# Patient Record
Sex: Male | Born: 1940 | Race: White | Hispanic: No | Marital: Married | Smoking: Former smoker
Health system: Southern US, Community
[De-identification: ages and names within clinical notes are randomized; demographics above are authoritative.]

## PROBLEM LIST (undated history)

## (undated) DIAGNOSIS — K219 Gastro-esophageal reflux disease without esophagitis: Secondary | ICD-10-CM

## (undated) DIAGNOSIS — E119 Type 2 diabetes mellitus without complications: Secondary | ICD-10-CM

## (undated) DIAGNOSIS — Z950 Presence of cardiac pacemaker: Secondary | ICD-10-CM

## (undated) DIAGNOSIS — Z8489 Family history of other specified conditions: Secondary | ICD-10-CM

## (undated) DIAGNOSIS — I2510 Atherosclerotic heart disease of native coronary artery without angina pectoris: Secondary | ICD-10-CM

## (undated) DIAGNOSIS — C801 Malignant (primary) neoplasm, unspecified: Secondary | ICD-10-CM

## (undated) DIAGNOSIS — I10 Essential (primary) hypertension: Secondary | ICD-10-CM

## (undated) DIAGNOSIS — N400 Enlarged prostate without lower urinary tract symptoms: Secondary | ICD-10-CM

## (undated) DIAGNOSIS — Z87442 Personal history of urinary calculi: Secondary | ICD-10-CM

## (undated) DIAGNOSIS — M1990 Unspecified osteoarthritis, unspecified site: Secondary | ICD-10-CM

## (undated) HISTORY — PX: LITHOTRIPSY: SUR834

## (undated) HISTORY — PX: EYE SURGERY: SHX253

## (undated) HISTORY — PX: PACEMAKER INSERTION: SHX728

## (undated) HISTORY — PX: OTHER SURGICAL HISTORY: SHX169

## (undated) HISTORY — PX: PACEMAKER REVISION: SHX5999

## (undated) HISTORY — PX: CARDIAC SURGERY: SHX584

---

## 1998-07-08 HISTORY — PX: CORONARY ARTERY BYPASS GRAFT: SHX141

## 1998-12-19 ENCOUNTER — Inpatient Hospital Stay (HOSPITAL_COMMUNITY): Admission: AD | Admit: 1998-12-19 | Discharge: 1998-12-26 | Payer: Self-pay | Admitting: Cardiovascular Disease

## 1998-12-20 ENCOUNTER — Encounter: Payer: Self-pay | Admitting: Thoracic Surgery (Cardiothoracic Vascular Surgery)

## 1998-12-22 ENCOUNTER — Encounter: Payer: Self-pay | Admitting: Thoracic Surgery (Cardiothoracic Vascular Surgery)

## 1998-12-23 ENCOUNTER — Encounter: Payer: Self-pay | Admitting: Thoracic Surgery (Cardiothoracic Vascular Surgery)

## 1998-12-24 ENCOUNTER — Encounter: Payer: Self-pay | Admitting: Surgery

## 2012-07-08 HISTORY — PX: BLEPHAROPLASTY: SUR158

## 2015-07-09 DIAGNOSIS — Z950 Presence of cardiac pacemaker: Secondary | ICD-10-CM

## 2015-07-09 HISTORY — DX: Presence of cardiac pacemaker: Z95.0

## 2016-06-18 ENCOUNTER — Inpatient Hospital Stay: Admission: RE | Admit: 2016-06-18 | Payer: Self-pay | Source: Ambulatory Visit

## 2016-06-19 ENCOUNTER — Inpatient Hospital Stay: Admission: RE | Admit: 2016-06-19 | Payer: Self-pay | Source: Ambulatory Visit

## 2016-06-20 ENCOUNTER — Encounter
Admission: RE | Admit: 2016-06-20 | Discharge: 2016-06-20 | Disposition: A | Discharge status: Home / Self Care | Payer: Medicare Other | Source: Ambulatory Visit | Attending: Urology | Admitting: Urology

## 2016-06-20 HISTORY — DX: Presence of cardiac pacemaker: Z95.0

## 2016-06-20 HISTORY — DX: Unspecified osteoarthritis, unspecified site: M19.90

## 2016-06-20 HISTORY — DX: Essential (primary) hypertension: I10

## 2016-06-20 HISTORY — DX: Family history of other specified conditions: Z84.89

## 2016-06-20 HISTORY — DX: Malignant (primary) neoplasm, unspecified: C80.1

## 2016-06-20 HISTORY — DX: Personal history of urinary calculi: Z87.442

## 2016-06-20 HISTORY — DX: Type 2 diabetes mellitus without complications: E11.9

## 2016-06-20 HISTORY — DX: Gastro-esophageal reflux disease without esophagitis: K21.9

## 2016-06-20 NOTE — Patient Instructions (Signed)
  Your procedure is scheduled on: 06-25-16 Report to Same Day Surgery 2nd floor medical mall [facility] Entrance-take elevator on left to 2nd floor.  Check in with surgery information desk.) To find out your arrival time please call [phone] between 1PM - 3PM on 06-24-16  Remember: Instructions that are not followed completely may result in serious medical risk, up to and including death, or upon the discretion of your surgeon and anesthesiologist your surgery may need to be rescheduled.    _x___ 1. Do not eat food or drink liquids after midnight. No gum chewing or hard candies.     __x__ 2. No Alcohol for 24 hours before or after surgery.   __x__3. No Smoking for 24 prior to surgery.   ____  4. Bring all medications with you on the day of surgery if instructed.    __x__ 5. Notify your doctor if there is any change in your medical condition     (cold, fever, infections).     Do not wear jewelry, make-up, hairpins, clips or nail polish.  Do not wear lotions, powders, or perfumes. You may wear deodorant.  Do not shave 48 hours prior to surgery. Men may shave face and neck.  Do not bring valuables to the hospital.    [facility] is not responsible for any belongings or valuables.               Contacts, dentures or bridgework may not be worn into surgery.  Leave your suitcase in the car. After surgery it may be brought to your room.  For patients admitted to the hospital, discharge time is determined by your treatment team.   Patients discharged the day of surgery will not be allowed to drive home.  You will need someone to drive you home and stay with you the night of your procedure.    Please read over the following fact sheets that you were given:   [facility] Preparing for Surgery and or MRSA Information   _x___ Take these medicines the morning of surgery with A SIP OF WATER:    1. QUINAPRIL  2. RISPERIDONE  3. ZANTAC  4.  5.  6.  ____Fleets enema or Magnesium  Citrate as directed.   ____ Use CHG Soap or sage wipes as directed on instruction sheet   ____ Use inhalers on the day of surgery and bring to hospital day of surgery  ____ Stop metformin 2 days prior to surgery    ____ Take 1/2 of usual insulin dose the night before surgery and none on the morning of surgery.   ____ Stop Aspirin, Coumadin, Pllavix ,Eliquis, Effient, or Pradaxa-PT STOPPED ASA 06-17-16  x__ Stop Anti-inflammatories such as Advil, Aleve, Ibuprofen, Motrin, Naproxen,          Naprosyn, Goodies powders or aspirin products. Ok to take Tylenol.   ____ Stop supplements until after surgery.    ____ Bring C-Pap to the hospital.

## 2016-06-20 NOTE — Pre-Procedure Instructions (Signed)
Procedure - Cardiac - Lucianne Lei, MD - 11/22/2015 10:07 AM EDT  ELECTROPHYSIOLOGY STUDY  DATE OF STUDY: 11/22/2015  PROCEDURE PERFORMED: Dual-chamber pacemaker generator change out.  INDICATIONS: Pacemaker end-of-life.  SYSTEM COMPONENTS: 1. Pacemaker generator is a Medtronic [id number], serial [id number]. 2. Atrial lead is a Guidant [id number], serial [id number]. Implant date 02/14/2006. 3. Right ventricular lead is a Medtronic [id number], serial [id number]. Implant was 02/14/2006.  Explanted generator is a Medtronic [id number], serial [id number]. Implant 02/14/2006. TYRX absorbable antibiotic pouch.  PROCEDURE DESCRIPTION: The patient was brought to the Electrophysiology Laboratory in a post-absorptive, nonsedated state. Noninvasive blood pressure and arterial oxygen saturation monitoring were established. Standard sterile prep and drape were performed. Standard timeout procedure and sedation were performed. Lead fluoroscopy was performed of generator and leads. The pacemaker pocket was opened over the existing incision. Generator leads were exposed. The leads were tested. Atrial lead P-wave 3.9, impedance 470, pacing threshold 0.6. R-wave 4.8, impedance 491, pacing threshold 0.9. Leads were then connected to the new pacer generator which was placed inside the TYRX pouch and placed into the pocket. Of note, the pocket had been opened inferior and posterior to the fibrous capsule and the pectoralis fascial plane. No active bleeding was present. The pocket was then irrigated with sterile bacitracin antibiotic solution and Arista Powder placed. The pocket was then closed in 3 running layers with absorbable suture followed by Steri-Strips, Dermabond and a Mepilex dressing. Sponge, needle, instrument counts were correct x 2. There were no complications.  ESTIMATED BLOOD LOSS: Less than 20 mL.  IMPRESSION: The patient is status post successful pacemaker generator change out.  Odette Fraction,  MD  D: 11/22/2015 10:07:10 T: 11/23/2015 04:52:17 DF/NTS [id number]  cc:  Back to top of Miscellaneous Notes D/C Summary - Lucianne Lei, MD - 11/22/2015 10:04 AM EDT  DISCHARGE SUMMARY  DATE OF ADMISSION: 11/22/2015.  DATE OF DISCHARGE: 11/22/2015.  DISCHARGE DIAGNOSES: 1. Pacemaker generator change out, dual chamber. 2. Paroxysmal high grade AV block. 3. Status post coronary artery bypass grafting. 4. Diabetes mellitus. 5. Hyperlipidemia.  PROCEDURES: Dual chamber pacemaker generator change out.  HOSPITAL COURSE: The patient was admitted with his pacemaker ER characteristics. Lead characteristics were normal. He underwent change out to a new Medtronic device. He was discharged home in stable condition.  DISCHARGE MANAGEMENT AND PLAN: The patient will be discharged for outpatient followup. He will continue same home medications detailed on [facility] including: 1. Allopurinol. 2. Trilipix. 3. Allegra. 4. Glucotrol-XL. 5. Trileptal. 6. Quinapril/hydrochlorothiazide. 7. Risperdal. 8. Crestor. 9. Januvia. 10. Flomax.  He has outpatient followup as scheduled.  Odette Fraction, MD  D: 11/22/2015 10:04:49 T: 11/23/2015 04:32:03 DF/NTS [id number]  cc:  Back to top of Miscellaneous Notes MD Prog Notes - Lucianne Lei, MD - 11/22/2015 9:58 AM EDT Only the most recent of 2 notes of type MD Prog Notes is displayed.  PM Gen Changeout [place] [phone]  Back to top of Miscellaneous Notes Plan of Care - Irma Newness, RN - 11/22/2015 6:53 AM EDT Problem: General Plan of Care Goal: Appropriate Progression to Discharge Outcome: Ongoing Bedrest teaching done,importance of keeping right arm or leg straight post procedure, to minimize or decrease chance of bruising post procedure. Handwashing, falls risk reviewed.    Back to top of Miscellaneous Notes   Plan of Treatment - as of this encounter  Scheduled Tests Scheduled Tests  Name  Priority Associated Diagnoses Order Schedule  EP LAB PROCEDURE Routine  ONCE for 1 Occurrences  starting 11/22/2015 until 11/22/2015   Lab Results - in this encounter  Table of Contents for Lab Results GLUCOSE, POC (GLUCOMETER) (11/22/2015 10:26 AM) TSH (11/22/2015 7:06 AM) MAGNESIUM (11/22/2015 7:06 AM) COMPREHENSIVE METABOLIC PANEL (99991111 7:06 AM) CBC WITH DIFF (11/22/2015 7:06 AM) BNP (11/22/2015 7:06 AM)    GLUCOSE, POC (GLUCOMETER) (11/22/2015 10:26 AM) Only the most recent of 2 results within the time period is included.  GLUCOSE, POC (GLUCOMETER) (11/22/2015 10:26 AM)  Component Value Ref Range  Glucose (poct) 160 (H)Comment: Notified RN 70 - 100 mg/dL   GLUCOSE, POC (GLUCOMETER) (11/22/2015 10:26 AM)  Specimen Performing Laboratory   INTERFACE USE   Back to top of Lab Results   TSH (11/22/2015 7:06 AM) TSH (11/22/2015 7:06 AM)  Component Value Ref Range  TSH 3.15 0.35 - 4.94 uIU/mL   TSH (11/22/2015 7:06 AM)  Specimen Performing Laboratory   INTERFACE USE   Back to top of Lab Results   MAGNESIUM (11/22/2015 7:06 AM) MAGNESIUM (11/22/2015 7:06 AM)  Component Value Ref Range  Magnesium 2.1 1.6 - 2.6 mg/dL   MAGNESIUM (11/22/2015 7:06 AM)  Specimen Performing Laboratory   INTERFACE USE   Back to top of Lab Results   COMPREHENSIVE METABOLIC PANEL (99991111 7:06 AM) COMPREHENSIVE METABOLIC PANEL (99991111 7:06 AM)  Component Value Ref Range  Sodium 136 136 - 145 mEq/L  Potassium 4.3 3.5 - 4.5 mEq/L  Chloride 104 98 - 107 mEq/L  Bicarbonate (TCO2) 20 (L) 23 - 31 mEq/L  Calcium 9.7 8.4 - 10.2 mg/dL  Glucose 159 (H) 70 - 105 mg/dL  BUN 15 8 - 26 mg/dL  Protein, Total 7.1 6.2 - 8.1 g/dL  Albumin 4.5 3.2 - 4.6 g/dL  Bilirubin, Total 0.4 0.2 - 1.2 mg/dL  Alkaline Phosphatase 53 40 - 150 U/L  AST (SGOT) 25 5 - 34 U/L  Creatinine 1.14 0.72 - 1.25 mg/dL  Anion Gap (calc.) 12 4 - 12 mEq/L  ALT (SGPT) 26 0 - 55 U/L  GFR, non-AA, (est.) 63 >59  mL/Min/1.73 m2  GFR, AA, (est.) 76 >59 mL/Min/1.73 m2   COMPREHENSIVE METABOLIC PANEL (99991111 7:06 AM)  Specimen Performing Laboratory   INTERFACE USE   Back to top of Lab Results   CBC WITH DIFF (11/22/2015 7:06 AM) CBC WITH DIFF (11/22/2015 7:06 AM)  Component Value Ref Range  WBC 6.20 4.5 - 11.0 k/uL  RBC 4.42 4.4 - 5.9 M/uL  Hemoglobin 14.0 13.0 - 18.0 g/dL  Hematocrit (calc.) 41.1 40 - 52 %  MCV 93 80 - 100 fL  MCH 31.7 26 - 34 pg  MCHC (calc.) 34.1 32 - 36 g/dL  RDW 13.9 <14.5 %  Platelet 138 (L) 150 - 440 k/uL  Mean Platelet Volume 9.9 7.4 - 10.6 fL  Neutrophil (%) 70 %  Lymphocyte (%) 18 %  Monocyte (%) 10 %  Eosinophil (%) 1 %  Basophil (%) 1 %  Neutrophil (#) 4.40 1.8 - 7.7 k/uL  Lymphocyte (#) 1.10 1.0 - 4.8 k/uL  Monocyte (#) 0.60 0.0 - 0.8 k/uL  Eosinophil (#) 0.10 0.0 - 0.5 k/uL  Basophil (#) 0.10 0.0 - 0.2 k/uL   CBC WITH DIFF (11/22/2015 7:06 AM)  Specimen Performing Laboratory   INTERFACE USE   Back to top of Lab Results   BNP (11/22/2015 7:06 AM) BNP (11/22/2015 7:06 AM)  Component Value Ref Range  Brain Natriuretic Peptide (BNP) 10 <100 pg/mL   BNP (11/22/2015 7:06 AM)  Specimen Performing Laboratory   INTERFACE USE  Back to top of Lab Results

## 2016-06-24 NOTE — Pre-Procedure Instructions (Signed)
CALLED PTS PCP OFFICE IN [place] TO SEE IF LABS AND EKG WERE COMPLETED-RECEPTIONIST STATES THAT HE HAD BLOODWORK DONE BUT THAT THE PCP HAS NOT SIGNED OFF ON THE LABS TO REVIEW THEM AND THEY CANNOT FAX ANY OF THIS UNTIL THE MD HAS SIGNED OFF. PT NOT FOR SURGERY UNTIL AFTER LUNCH SO I WILL TRY AGAIN IN AM

## 2016-06-25 ENCOUNTER — Ambulatory Visit: Payer: Medicare Other | Admitting: Anesthesiology

## 2016-06-25 ENCOUNTER — Ambulatory Visit
Admission: RE | Admit: 2016-06-25 | Discharge: 2016-06-25 | Disposition: A | Discharge status: Home / Self Care | Payer: Medicare Other | Source: Ambulatory Visit | Attending: Urology | Admitting: Urology

## 2016-06-25 ENCOUNTER — Encounter: Payer: Self-pay | Admitting: *Deleted

## 2016-06-25 ENCOUNTER — Encounter
Admission: RE | Disposition: A | Discharge status: Home / Self Care | Payer: Self-pay | Source: Ambulatory Visit | Attending: Urology

## 2016-06-25 DIAGNOSIS — N401 Enlarged prostate with lower urinary tract symptoms: Secondary | ICD-10-CM | POA: Insufficient documentation

## 2016-06-25 DIAGNOSIS — M1990 Unspecified osteoarthritis, unspecified site: Secondary | ICD-10-CM | POA: Diagnosis not present

## 2016-06-25 DIAGNOSIS — Z87891 Personal history of nicotine dependence: Secondary | ICD-10-CM | POA: Insufficient documentation

## 2016-06-25 DIAGNOSIS — I10 Essential (primary) hypertension: Secondary | ICD-10-CM | POA: Insufficient documentation

## 2016-06-25 DIAGNOSIS — I252 Old myocardial infarction: Secondary | ICD-10-CM | POA: Insufficient documentation

## 2016-06-25 DIAGNOSIS — E7800 Pure hypercholesterolemia, unspecified: Secondary | ICD-10-CM | POA: Insufficient documentation

## 2016-06-25 DIAGNOSIS — Z7982 Long term (current) use of aspirin: Secondary | ICD-10-CM | POA: Diagnosis not present

## 2016-06-25 DIAGNOSIS — K219 Gastro-esophageal reflux disease without esophagitis: Secondary | ICD-10-CM | POA: Diagnosis not present

## 2016-06-25 DIAGNOSIS — F329 Major depressive disorder, single episode, unspecified: Secondary | ICD-10-CM | POA: Diagnosis not present

## 2016-06-25 DIAGNOSIS — M109 Gout, unspecified: Secondary | ICD-10-CM | POA: Diagnosis not present

## 2016-06-25 DIAGNOSIS — Z79899 Other long term (current) drug therapy: Secondary | ICD-10-CM | POA: Diagnosis not present

## 2016-06-25 DIAGNOSIS — Z85828 Personal history of other malignant neoplasm of skin: Secondary | ICD-10-CM | POA: Diagnosis not present

## 2016-06-25 DIAGNOSIS — N138 Other obstructive and reflux uropathy: Secondary | ICD-10-CM | POA: Insufficient documentation

## 2016-06-25 DIAGNOSIS — N320 Bladder-neck obstruction: Secondary | ICD-10-CM | POA: Diagnosis not present

## 2016-06-25 DIAGNOSIS — F419 Anxiety disorder, unspecified: Secondary | ICD-10-CM | POA: Diagnosis not present

## 2016-06-25 DIAGNOSIS — E119 Type 2 diabetes mellitus without complications: Secondary | ICD-10-CM | POA: Diagnosis not present

## 2016-06-25 DIAGNOSIS — Z950 Presence of cardiac pacemaker: Secondary | ICD-10-CM | POA: Insufficient documentation

## 2016-06-25 HISTORY — PX: GREEN LIGHT LASER TURP (TRANSURETHRAL RESECTION OF PROSTATE: SHX6260

## 2016-06-25 LAB — GLUCOSE, CAPILLARY
GLUCOSE-CAPILLARY: 129 mg/dL — AB (ref 65–99)
GLUCOSE-CAPILLARY: 113 mg/dL — AB (ref 65–99)

## 2016-06-25 SURGERY — GREEN LIGHT LASER TURP (TRANSURETHRAL RESECTION OF PROSTATE
Anesthesia: General

## 2016-06-25 MED ORDER — FENTANYL CITRATE (PF) 100 MCG/2ML IJ SOLN
INTRAMUSCULAR | Status: DC | PRN
  Administered 2016-06-25: 50 ug via INTRAVENOUS
  Administered 2016-06-25: 25 ug via INTRAVENOUS
  Administered 2016-06-25 (×2): 50 ug via INTRAVENOUS
  Administered 2016-06-25: 25 ug via INTRAVENOUS

## 2016-06-25 MED ORDER — BELLADONNA ALKALOIDS-OPIUM 16.2-60 MG RE SUPP
RECTAL | Status: AC
  Filled 2016-06-25: qty 1

## 2016-06-25 MED ORDER — CEFAZOLIN SODIUM-DEXTROSE 2-4 GM/100ML-% IV SOLN
2.0000 g | Freq: Once | INTRAVENOUS | Status: AC
  Administered 2016-06-25: 2 g via INTRAVENOUS

## 2016-06-25 MED ORDER — BELLADONNA ALKALOIDS-OPIUM 16.2-60 MG RE SUPP
RECTAL | Status: DC | PRN
  Administered 2016-06-25: 1 via RECTAL

## 2016-06-25 MED ORDER — ONDANSETRON HCL 4 MG/2ML IJ SOLN: 4.0000 mg | Freq: Once | INTRAMUSCULAR | Status: DC | PRN

## 2016-06-25 MED ORDER — CEFAZOLIN SODIUM-DEXTROSE 2-4 GM/100ML-% IV SOLN
INTRAVENOUS | Status: AC
Start: 2016-06-25 — End: 2016-06-25
  Administered 2016-06-25: 2 g via INTRAVENOUS
  Filled 2016-06-25: qty 100

## 2016-06-25 MED ORDER — SODIUM CHLORIDE 0.9 % IV SOLN
INTRAVENOUS | Status: DC | PRN
  Administered 2016-06-25: 50 ug/min via INTRAVENOUS

## 2016-06-25 MED ORDER — MIDAZOLAM HCL 2 MG/2ML IJ SOLN
INTRAMUSCULAR | Status: DC | PRN
  Administered 2016-06-25: 1 mg via INTRAVENOUS

## 2016-06-25 MED ORDER — ONDANSETRON HCL 4 MG/2ML IJ SOLN
INTRAMUSCULAR | Status: DC | PRN
  Administered 2016-06-25: 4 mg via INTRAVENOUS

## 2016-06-25 MED ORDER — SODIUM CHLORIDE 0.9 % IV SOLN
INTRAVENOUS | Status: DC
  Administered 2016-06-25: 50 mL/h via INTRAVENOUS

## 2016-06-25 MED ORDER — GLYCOPYRROLATE 0.2 MG/ML IJ SOLN
INTRAMUSCULAR | Status: DC | PRN
  Administered 2016-06-25: .2 mg via INTRAVENOUS

## 2016-06-25 MED ORDER — FENTANYL CITRATE (PF) 100 MCG/2ML IJ SOLN
25.0000 ug | INTRAMUSCULAR | Status: DC | PRN
  Administered 2016-06-25 (×2): 25 ug via INTRAVENOUS

## 2016-06-25 MED ORDER — LIDOCAINE HCL (CARDIAC) 20 MG/ML IV SOLN
INTRAVENOUS | Status: DC | PRN
  Administered 2016-06-25: 100 mg via INTRAVENOUS

## 2016-06-25 MED ORDER — NUCYNTA 50 MG PO TABS: 50.0000 mg | ORAL_TABLET | Freq: Four times a day (QID) | ORAL | 0 refills | Status: DC | PRN

## 2016-06-25 MED ORDER — DOCUSATE SODIUM 100 MG PO CAPS: 200.0000 mg | ORAL_CAPSULE | Freq: Two times a day (BID) | ORAL | 3 refills | Status: DC

## 2016-06-25 MED ORDER — LIDOCAINE HCL 2 % EX GEL
CUTANEOUS | Status: DC | PRN
  Administered 2016-06-25: 1 via URETHRAL

## 2016-06-25 MED ORDER — PROPOFOL 10 MG/ML IV BOLUS
INTRAVENOUS | Status: DC | PRN
  Administered 2016-06-25: 140 mg via INTRAVENOUS

## 2016-06-25 MED ORDER — LEVOFLOXACIN 500 MG PO TABS: 500.0000 mg | ORAL_TABLET | Freq: Every day | ORAL | 1 refills | Status: DC

## 2016-06-25 MED ORDER — LIDOCAINE HCL 2 % EX GEL
CUTANEOUS | Status: AC
  Filled 2016-06-25: qty 10

## 2016-06-25 MED ORDER — FENTANYL CITRATE (PF) 100 MCG/2ML IJ SOLN
INTRAMUSCULAR | Status: AC
  Administered 2016-06-25: 25 ug via INTRAVENOUS
  Filled 2016-06-25: qty 2

## 2016-06-25 MED ORDER — PHENYLEPHRINE HCL 10 MG/ML IJ SOLN
INTRAMUSCULAR | Status: DC | PRN
  Administered 2016-06-25: 200 ug via INTRAVENOUS
  Administered 2016-06-25: 100 ug via INTRAVENOUS

## 2016-06-25 MED ORDER — ONDANSETRON 8 MG PO TBDP: 8.0000 mg | ORAL_TABLET | Freq: Four times a day (QID) | ORAL | 3 refills | Status: DC | PRN

## 2016-06-25 SURGICAL SUPPLY — 30 items
ADAPTER IRRIG TUBE 2 SPIKE SOL (ADAPTER) ×6 IMPLANT
ADPR TBG 2 SPK PMP STRL ASCP (ADAPTER) ×2
BAG URO DRAIN 2000ML W/SPOUT (MISCELLANEOUS) ×3 IMPLANT
CATH FOLEY 2WAY  5CC 20FR SIL (CATHETERS) ×2
CATH FOLEY 2WAY 5CC 20FR SIL (CATHETERS) ×1 IMPLANT
GLOVE BIO SURGEON STRL SZ7 (GLOVE) ×6 IMPLANT
GLOVE BIO SURGEON STRL SZ7.5 (GLOVE) ×3 IMPLANT
GOWN STRL REUS W/ TWL LRG LVL3 (GOWN DISPOSABLE) ×1 IMPLANT
GOWN STRL REUS W/ TWL XL LVL3 (GOWN DISPOSABLE) ×1 IMPLANT
GOWN STRL REUS W/TWL LRG LVL3 (GOWN DISPOSABLE) ×3
GOWN STRL REUS W/TWL XL LVL3 (GOWN DISPOSABLE) ×3
IV NS 1000ML (IV SOLUTION) ×3
IV NS 1000ML BAXH (IV SOLUTION) ×1 IMPLANT
IV SET PRIMARY 15D 139IN B9900 (IV SETS) ×3 IMPLANT
KIT RM TURNOVER CYSTO AR (KITS) ×3 IMPLANT
LASER GRNLGT 950 (MISCELLANEOUS) ×3 IMPLANT
LASER GRNLGT MOXY FIBER 750UM (MISCELLANEOUS) ×3 IMPLANT
PACK CYSTO AR (MISCELLANEOUS) ×3 IMPLANT
PREP PVP WINGED SPONGE (MISCELLANEOUS) ×3 IMPLANT
SET IRRIG Y TYPE TUR BLADDER L (SET/KITS/TRAYS/PACK) ×3 IMPLANT
SOL .9 NS 3000ML IRR  AL (IV SOLUTION) ×8
SOL .9 NS 3000ML IRR AL (IV SOLUTION) ×4
SOL .9 NS 3000ML IRR UROMATIC (IV SOLUTION) ×4 IMPLANT
SOL PREP PVP 2OZ (MISCELLANEOUS) ×3
SOLUTION PREP PVP 2OZ (MISCELLANEOUS) ×1 IMPLANT
SURGILUBE 2OZ TUBE FLIPTOP (MISCELLANEOUS) ×3 IMPLANT
SYRINGE IRR TOOMEY STRL 70CC (SYRINGE) ×3 IMPLANT
TUBING CONNECTING 10 (TUBING) ×2 IMPLANT
TUBING CONNECTING 10' (TUBING) ×1
WATER STERILE IRR 1000ML POUR (IV SOLUTION) ×3 IMPLANT

## 2016-06-25 NOTE — OR Nursing (Signed)
Patient did not have an ekg performed in pcp office yesterday.  Obtained one in [facility] today. Dr. Rosey Bath aware of readout of initial printout and felt it was okay to proceed with surgery

## 2016-06-25 NOTE — H&P (Signed)
Date of Initial H&P: 06/24/16  History reviewed, patient examined, no change in status, stable for surgery.

## 2016-06-25 NOTE — H&P (Signed)
NAMEJOSTEN, COVINGTON NO.:  [id number]  MEDICAL RECORD NO.:  [id number]  LOCATION:                                 FACILITY:  PHYSICIAN:  Maryan Puls          DATE OF BIRTH:  [birth year]  DATE OF ADMISSION:  06/25/2016 DATE OF DISCHARGE:                            HISTORY AND PHYSICAL   CHIEF COMPLAINT:  Difficulty voiding.  HISTORY OF PRESENT ILLNESS:  Mr. Mustian is a 75 year old white male with a long history of BPH and LUTS.  He is currently being treated with tamsulosin 0.4 mg per day.  Symptoms have worsened significantly recently in spite of treatment.  He comes in now for photovaporization of prostate with GreenLight laser.  Evaluation in the office included cystoscopy on November 30, which indicated trilobar BPH with a 4 cm prostatic urethral length and median lobe present.  Prostate ultrasound on November 29 indicated a 94.7 g prostate.  Uroflow study dated November 28 indicated a maximum flow rate of 11 mL/second with an average flow rate of 5.9 mL/second and a residual of 321 mL based upon a voided volume of 257 mL.  PSA was 4.2 ng/mL on November the 18.  ALLERGIES:  THE PATIENT WAS ALLERGIC TO TETANUS, VYTORIN, AND MEVACOR.  CURRENT MEDICATIONS:  Include Crestor, Trilipix, oxycodone, omeprazole, Senokot, Januvia, Effexor, risperidone, allopurinol, aspirin, ranitidine, and tamsulosin.  PAST SURGICAL HISTORY: 1. Coronary artery bypass graft. 2. Ureteroscopic stone extraction.  PAST AND CURRENT MEDICAL CONDITION: 1. History of coronary artery disease. 2. Diabetes. 3. Hypertension. 4. Gout. 5. Hypercholesterolemia. 6. Depression with anxiety.  REVIEW OF SYSTEMS:  The patient denied chest pain, shortness of breath, or stroke.  PHYSICAL EXAMINATION:  GENERAL:  Well-nourished white male, in no distress. HEENT:  Sclerae were clear. NECK:  Supple.  No palpable cervical adenopathy. LUNGS:  Clear to auscultation. CARDIOVASCULAR:  Regular  rhythm and rate without audible murmurs. ABDOMEN:  Soft and nontender abdomen. GU:  Uncircumcised testes, smooth and nontender. RECTAL:  Greater than 50 g, smooth, nontender prostate. NEUROMUSCULAR:  Grossly intact.  IMPRESSION:  Trilobar BPH with bladder outlet obstruction.  PLAN:  Photovaporization of prostate with GreenLight laser.          ______________________________ Maryan Puls     MW/MEDQ  D:  06/24/2016  T:  06/24/2016  Job:  [id number]

## 2016-06-25 NOTE — Transfer of Care (Signed)
Immediate Anesthesia Transfer of Care Note  Patient: Dylan Santiago  Procedure(s) Performed: Procedure(s): GREEN LIGHT LASER TURP (TRANSURETHRAL RESECTION OF PROSTATE (N/A)  Patient Location: PACU  Anesthesia Type:General  Level of Consciousness: sedated  Airway & Oxygen Therapy: Patient Spontanous Breathing and Patient connected to face mask oxygen  Post-op Assessment: Report given to RN and Post -op Vital signs reviewed and stable  Post vital signs: Reviewed and stable  Last Vitals:  Vitals:   06/25/16 1344 06/25/16 1750  BP: 134/67 134/79  Pulse: 93 80  Resp: 16 13  Temp: 36.5 C 36.4 C    Last Pain:  Vitals:   06/25/16 1344  TempSrc: Oral      Patients Stated Pain Goal: 0 ([id number])  Complications: No apparent anesthesia complications

## 2016-06-25 NOTE — Discharge Instructions (Addendum)
AMBULATORY SURGERY  DISCHARGE INSTRUCTIONS   1) The drugs that you were given will stay in your system until tomorrow so for the next 24 hours you should not:  A) Drive an automobile B) Make any legal decisions C) Drink any alcoholic beverage   2) You may resume regular meals tomorrow.  Today it is better to start with liquids and gradually work up to solid foods.  You may eat anything you prefer, but it is better to start with liquids, then soup and crackers, and gradually work up to solid foods.   3) Please notify your doctor immediately if you have any unusual bleeding, trouble breathing, redness and pain at the surgery site, drainage, fever, or pain not relieved by medication. 4)   5) Your post-operative visit with Dr.                                     is: Date:                        Time:    Please call to schedule your post-operative visit.  6) Additional Instructions: Benign Prostatic Hyperplasia An enlarged prostate (benign prostatic hyperplasia) is common in older men. You may experience the following:  Weak urine stream.  Dribbling.  Feeling like the bladder has not emptied completely.  Difficulty starting urination.  Getting up frequently at night to urinate.  Urinating more frequently during the day. HOME CARE INSTRUCTIONS  Monitor your prostatic hyperplasia for any changes. The following actions may help to alleviate any discomfort you are experiencing:  Give yourself time when you urinate.  Stay away from alcohol.  Avoid beverages containing caffeine, such as coffee, tea, and colas, because they can make the problem worse.  Avoid decongestants, antihistamines, and some prescription medicines that can make the problem worse.  Follow up with your health care provider for further treatment as recommended. SEEK MEDICAL CARE IF:  You are experiencing progressive difficulty voiding.  Your urine stream is progressively getting narrower.  You are  awaking from sleep with the urge to void more frequently.  You are constantly feeling the need to void.  You experience loss of urine, especially in small amounts. SEEK IMMEDIATE MEDICAL CARE IF:   You develop increased pain with urination or are unable to urinate.  You develop severe abdominal pain, vomiting, a high fever, or fainting.  You develop back pain or blood in your urine. MAKE SURE YOU:   Understand these instructions.  Will watch your condition.  Will get help right away if you are not doing well or get worse. This information is not intended to replace advice given to you by your health care provider. Make sure you discuss any questions you have with your health care provider. Document Released: 06/24/2005 Document Revised: 07/15/2014 Document Reviewed: 11/24/2012 Elsevier Interactive Patient Education  2017 [place], Adult A Foley catheter is a soft, flexible tube. This tube is placed into your bladder to drain pee (urine). If you go home with this catheter in place, follow the instructions below. TAKING CARE OF THE CATHETER 1. Wash your hands with soap and water. 2. Put soap and water on a clean washcloth.  Clean the skin where the tube goes into your body.  Clean away from the tube site.  Never wipe toward the tube.  Clean the area using a  circular motion.  Remove all the soap. Pat the area dry with a clean towel. For males, reposition the skin that covers the end of the penis (foreskin). 3. Attach the tube to your leg with tape or a leg strap. Do not stretch the tube tight. If you are using tape, remove any stickiness left behind by past tape you used. 4. Keep the drainage bag below your hips. Keep it off the floor. 5. Check your tube during the day. Make sure it is working and draining. Make sure the tube does not curl, twist, or bend. 6. Do not pull on the tube or try to take it out. TAKING CARE OF THE DRAINAGE BAGS You will  have a large overnight drainage bag and a small leg bag. You may wear the overnight bag any time. Never wear the small bag at night. Follow the directions below. Emptying the Drainage Bag  Empty your drainage bag when it is ?- full or at least 2-3 times a day. 1. Wash your hands with soap and water. 2. Keep the drainage bag below your hips. 3. Hold the dirty bag over the toilet or clean container. 4. Open the pour spout at the bottom of the bag. Empty the pee into the toilet or container. Do not let the pour spout touch anything. 5. Clean the pour spout with a gauze pad or cotton ball that has rubbing alcohol on it. 6. Close the pour spout. 7. Attach the bag to your leg with tape or a leg strap. 8. Wash your hands well. Changing the Drainage Bag  Change your bag once a month or sooner if it starts to smell or look dirty.  1. Wash your hands with soap and water. 2. Pinch the rubber tube so that pee does not spill out. 3. Disconnect the catheter tube from the drainage tube at the connection valve. Do not let the tubes touch anything. 4. Clean the end of the catheter tube with an alcohol wipe. Clean the end of a the drainage tube with a different alcohol wipe. 5. Connect the catheter tube to the drainage tube of the clean drainage bag. 6. Attach the new bag to the leg with tape or a leg strap. Avoid attaching the new bag too tightly. 7. Wash your hands well. Cleaning the Drainage Bag  1. Wash your hands with soap and water. 2. Wash the bag in warm, soapy water. 3. Rinse the bag with warm water. 4. Fill the bag with a mixture of white vinegar and water (1 cup vinegar to 1 quart warm water [.2 liter vinegar to 1 liter warm water]). Close the bag and soak it for 30 minutes in the solution. 5. Rinse the bag with warm water. 6. Hang the bag to dry with the pour spout open and hanging downward. 7. Store the clean bag (once it is dry) in a clean plastic bag. 8. Wash your hands well. PREVENT  INFECTION  Wash your hands before and after touching your tube.  Take showers every day. Wash the skin where the tube enters your body. Do not take baths. Replace wet leg straps with dry ones, if this applies.  Do not use powders, sprays, or lotions on the genital area. Only use creams, lotions, or ointments as told by your doctor.  For females, wipe from front to back after going to the bathroom.  Drink enough fluids to keep your pee clear or pale yellow unless you are told not to have too much  fluid (fluid restriction).  Do not let the drainage bag or tubing touch or lie on the floor.  Wear cotton underwear to keep the area dry. GET HELP IF:  Your pee is cloudy or smells unusually bad.  Your tube becomes clogged.  You are not draining pee into the bag or your bladder feels full.  Your tube starts to leak. GET HELP RIGHT AWAY IF:  You have pain, puffiness (swelling), redness, or yellowish-white fluid (pus) where the tube enters the body.  You have pain in the belly (abdomen), legs, lower back, or bladder.  You have a fever.  You see blood fill the tube, or your pee is pink or red.  You feel sick to your stomach (nauseous), throw up (vomit), or have chills.  Your tube gets pulled out. MAKE SURE YOU:   Understand these instructions.  Will watch your condition.  Will get help right away if you are not doing well or get worse. This information is not intended to replace advice given to you by your health care provider. Make sure you discuss any questions you have with your health care provider. Document Released: 10/19/2012 Document Revised: 07/15/2014 Document Reviewed: 06/10/2015 Elsevier Interactive Patient Education  2017 [facility], Care After This sheet gives you information about how to care for yourself after your procedure. Your health care provider may also give you more specific instructions. If you have problems or questions,  contact your health care provider. What can I expect after the procedure? For the first few weeks after the procedure:  You will feel a need to urinate often.  You may have blood in your urine.  You may feel a sudden need to urinate. Once your urinary catheter is removed, you may have a burning feeling when you urinate, especially at the end of urination. This feeling usually passes within 3-5 days. Follow these instructions at home: Activity  Return to your normal activities as told by your health care provider. Ask your health care provider what activities are safe for you.  Do not do vigorous exercise for 1 week or as told by your health care provider.  Do not lift anything that is heavier than 10 lb (4.5 kg) until your health care provider say it is safe.  Avoid sexual activity for 4-6 weeks or as told by your health care provider.  Do not ride in a car for extended periods of time for 1 month or as told by your health care provider.  Do not drive for 24 hours if you were given a medicine to help you relax (sedative). Diet  Eat foods that are high in fiber, such as fresh fruits and vegetables, whole grains, and beans.  Drink enough fluid to keep your urine clear or pale yellow. Medicines  Take over-the-counter and prescription medicines, including stool softeners, only as told by your health care provider.  If you were prescribed an antibiotic medicine, take it as told by your health care provider. Do not stop taking the antibiotic even if you start to feel better. General instructions  If you were given elastic support stockings, wear them as told by your health care provider.  Do not strain to have a bowel movement. Straining may lead to bleeding from the prostate and cause clots to form and cause trouble urinating.  Keep all follow-up visits as told by your health care provider. This is important. Contact a health care provider if:  You have a fever or  chills.  You  have spasms or pain with the urinary catheter still in place.  Once the catheter has been removed, you experience difficulty starting your stream when attempting to urinate. Get help right away if:  There is a blockage in your catheter.  Your catheter has been removed and you are suddenly unable to urinate.  Your urine smells unusually bad.  You start to have blood clots in your urine.  The blood in your urine becomes persistent or gets thick.  You develop chest pains.  You develop shortness of breath.  You develop swelling or pain in your leg. Summary  You may notice urinary symptoms for a few weeks after your procedure.  Follow instructions from your health care provider regarding activity restrictions such as lifting, exercise, and sexual activity.  Contact your health care provider if you have any unusual symptoms during your recovery. This information is not intended to replace advice given to you by your health care provider. Make sure you discuss any questions you have with your health care provider. Document Released: 06/24/2005 Document Revised: 02/09/2016 Document Reviewed: 02/09/2016 Elsevier Interactive Patient Education  2017 [employer].

## 2016-06-25 NOTE — Anesthesia Preprocedure Evaluation (Signed)
Anesthesia Evaluation  Patient identified by MRN, date of birth, ID band Patient awake    Reviewed: Allergy & Precautions, H&P , NPO status , Patient's Chart, lab work & pertinent test results, reviewed documented beta blocker date and time   History of Anesthesia Complications (+) Family history of anesthesia reaction and history of anesthetic complications  Airway Mallampati: I  TM Distance: >3 FB Neck ROM: full    Dental  (+) Caps, Missing   Pulmonary neg sleep apnea, neg COPD, neg recent URI, former smoker,    Pulmonary exam normal breath sounds clear to auscultation       Cardiovascular Exercise Tolerance: Good hypertension, (-) angina+ CAD and + CABG  (-) Past MI and (-) Cardiac Stents Normal cardiovascular exam+ dysrhythmias + pacemaker + Valvular Problems/Murmurs  Rhythm:regular Rate:Normal     Neuro/Psych negative neurological ROS  negative psych ROS   GI/Hepatic Neg liver ROS, GERD  ,  Endo/Other  diabetes  Renal/GU Renal disease (kidney stones)  negative genitourinary   Musculoskeletal   Abdominal   Peds  Hematology negative hematology ROS (+)   Anesthesia Other Findings Past Medical History: No date: Arthritis No date: Cancer ([place])     Comment: skin cancer (right ear) No date: Diabetes mellitus without complication (HCC) No date: Family history of adverse reaction to anesthes*     Comment: mom-n/v No date: GERD (gastroesophageal reflux disease) No date: History of kidney stones No date: Hypertension 2017: Presence of permanent cardiac pacemaker     Comment: second pacemaker placed 3 months ago   Reproductive/Obstetrics negative OB ROS                             Anesthesia Physical Anesthesia Plan  ASA: III  Anesthesia Plan: General   Post-op Pain Management:    Induction:   Airway Management Planned:   Additional Equipment:   Intra-op Plan:    Post-operative Plan:   Informed Consent: I have reviewed the patients History and Physical, chart, labs and discussed the procedure including the risks, benefits and alternatives for the proposed anesthesia with the patient or authorized representative who has indicated his/her understanding and acceptance.   Dental Advisory Given  Plan Discussed with: Anesthesiologist, CRNA and Surgeon  Anesthesia Plan Comments:         Anesthesia Quick Evaluation

## 2016-06-25 NOTE — Op Note (Signed)
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 Yves Dill MD  Anesthesia: General  Indications:See the history and physical. After informed consent the above procedure(s) were requested     Technique and findings: After adequate general anesthesia been obtained the patient was placed into dorsal lithotomy position and the perineum was prepped and draped in the usual fashion. The Laserscope was coupled to the camera and visually advanced into the bladder. No bladder tumors were identified. Trilobar BPH with visual obstruction was noted. Bladder was moderately trabeculated. Numerous sand like bladder stones were present. At this point the greenlight laser fiber was introduced through the scope and power set at 39 W. The bladder neck tissue was vaporized. The power was increased to 120 W and tissue from the bladder neck to the verumontanum was vaporized. Finally, the power was increased to 180 W and remaining obstructive tissue was vaporized. The scope was then removed and 10 cc of viscous Xylocaine instilled within the bladder. A 20 French silicone catheter was placed with catheter guide. Catheter was irrigated until clear. A B&O suppository was placed. The procedure was then terminated and patient transferred to the recovery room in stable condition.

## 2016-06-25 NOTE — Anesthesia Procedure Notes (Signed)
Procedure Name: LMA Insertion Date/Time: 06/25/2016 4:02 PM Performed by: Silvana Newness Pre-anesthesia Checklist: Patient identified, Emergency Drugs available, Suction available, Patient being monitored and Timeout performed Patient Re-evaluated:Patient Re-evaluated prior to inductionOxygen Delivery Method: Circle system utilized Preoxygenation: Pre-oxygenation with 100% oxygen Intubation Type: IV induction Ventilation: Mask ventilation without difficulty LMA: LMA inserted LMA Size: 4.0 Number of attempts: 1 Placement Confirmation: positive ETCO2 and breath sounds checked- equal and bilateral Tube secured with: Tape Dental Injury: Teeth and Oropharynx as per pre-operative assessment

## 2016-06-26 ENCOUNTER — Encounter: Payer: Self-pay | Admitting: Urology

## 2016-06-26 NOTE — Anesthesia Postprocedure Evaluation (Signed)
Anesthesia Post Note  Patient: Dylan Santiago  Procedure(s) Performed: Procedure(s) (LRB): GREEN LIGHT LASER TURP (TRANSURETHRAL RESECTION OF PROSTATE (N/A)  Patient location during evaluation: PACU Anesthesia Type: General Level of consciousness: awake and alert Pain management: pain level controlled Vital Signs Assessment: post-procedure vital signs reviewed and stable Respiratory status: spontaneous breathing, nonlabored ventilation, respiratory function stable and patient connected to nasal cannula oxygen Cardiovascular status: blood pressure returned to baseline and stable Postop Assessment: no signs of nausea or vomiting Anesthetic complications: no     Last Vitals:  Vitals:   06/25/16 1839 06/25/16 1845  BP: (!) 148/76 (!) 150/80  Pulse: 87 88  Resp: 14 16  Temp:      Last Pain:  Vitals:   06/25/16 1845  TempSrc:   PainSc: [place]

## 2017-04-09 ENCOUNTER — Emergency Department
Admission: EM | Admit: 2017-04-09 | Discharge: 2017-04-09 | Disposition: A | Discharge status: Home / Self Care | Payer: Medicare Other | Attending: Emergency Medicine | Admitting: Emergency Medicine

## 2017-04-09 ENCOUNTER — Encounter: Payer: Self-pay | Admitting: Emergency Medicine

## 2017-04-09 DIAGNOSIS — F4322 Adjustment disorder with anxiety: Secondary | ICD-10-CM

## 2017-04-09 DIAGNOSIS — F32A Depression, unspecified: Secondary | ICD-10-CM

## 2017-04-09 DIAGNOSIS — R531 Weakness: Secondary | ICD-10-CM | POA: Diagnosis present

## 2017-04-09 DIAGNOSIS — I10 Essential (primary) hypertension: Secondary | ICD-10-CM | POA: Insufficient documentation

## 2017-04-09 DIAGNOSIS — Z7984 Long term (current) use of oral hypoglycemic drugs: Secondary | ICD-10-CM | POA: Insufficient documentation

## 2017-04-09 DIAGNOSIS — F4320 Adjustment disorder, unspecified: Secondary | ICD-10-CM | POA: Insufficient documentation

## 2017-04-09 DIAGNOSIS — F331 Major depressive disorder, recurrent, moderate: Secondary | ICD-10-CM

## 2017-04-09 DIAGNOSIS — E119 Type 2 diabetes mellitus without complications: Secondary | ICD-10-CM | POA: Diagnosis not present

## 2017-04-09 DIAGNOSIS — Z87891 Personal history of nicotine dependence: Secondary | ICD-10-CM | POA: Insufficient documentation

## 2017-04-09 DIAGNOSIS — Z79899 Other long term (current) drug therapy: Secondary | ICD-10-CM | POA: Insufficient documentation

## 2017-04-09 DIAGNOSIS — F329 Major depressive disorder, single episode, unspecified: Secondary | ICD-10-CM | POA: Insufficient documentation

## 2017-04-09 LAB — URINALYSIS, COMPLETE (UACMP) WITH MICROSCOPIC
BACTERIA UA: NONE SEEN
Bilirubin Urine: NEGATIVE
Glucose, UA: NEGATIVE mg/dL
HGB URINE DIPSTICK: NEGATIVE
KETONES UR: NEGATIVE mg/dL
NITRITE: NEGATIVE
PH: 6 (ref 5.0–8.0)
PROTEIN: NEGATIVE mg/dL
Specific Gravity, Urine: 1.014 (ref 1.005–1.030)

## 2017-04-09 LAB — TROPONIN I: Troponin I: <0.03 ng/mL (ref <0.03)

## 2017-04-09 LAB — BASIC METABOLIC PANEL
ANION GAP: 10 (ref 5–15)
BUN: 17 mg/dL (ref 6–20)
CHLORIDE: 103 mmol/L (ref 101–111)
CO2: 23 mmol/L (ref 22–32)
Calcium: 10.1 mg/dL (ref 8.9–10.3)
Creatinine, Ser: 1.03 mg/dL (ref 0.61–1.24)
GFR calc Af Amer: >60 mL/min (ref >60)
GLUCOSE: 213 mg/dL — AB (ref 65–99)
POTASSIUM: 3.8 mmol/L (ref 3.5–5.1)
Sodium: 136 mmol/L (ref 135–145)

## 2017-04-09 LAB — CBC
HEMATOCRIT: 39.4 % — AB (ref 40.0–52.0)
HEMOGLOBIN: 13.9 g/dL (ref 13.0–18.0)
MCH: 32.7 pg (ref 26.0–34.0)
MCHC: 35.4 g/dL (ref 32.0–36.0)
MCV: 92.3 fL (ref 80.0–100.0)
Platelets: 148 10*3/uL — ABNORMAL LOW (ref 150–440)
RBC: 4.27 MIL/uL — ABNORMAL LOW (ref 4.40–5.90)
RDW: 14.1 % (ref 11.5–14.5)
WBC: 7.5 10*3/uL (ref 3.8–10.6)

## 2017-04-09 LAB — TSH: TSH: 2.287 u[IU]/mL (ref 0.350–4.500)

## 2017-04-09 MED ORDER — MIRTAZAPINE 15 MG PO TABS: 15.0000 mg | ORAL_TABLET | Freq: Every day | ORAL | 1 refills | Status: DC
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
## 2017-04-09 NOTE — ED Notes (Signed)
Pt came in for weakness. States he feels it is coming from anxiety medications. States he takes a lot of medications and get little to no sleep r/t to wife's medical diagnosis. Wife is mute, but writes note to RN stated pt has felt week for the past year and has trouble getting to bed at nite. Pt is shaky and overwhelmed.
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
## 2017-04-09 NOTE — ED Notes (Signed)
Family at bedside. 

## 2017-04-09 NOTE — ED Notes (Signed)
Family at bedside. Pt has call light with in reach. Bed lowest position.

## 2017-04-09 NOTE — ED Provider Notes (Signed)
-----------------------------------------   3:32 PM on 04/09/2017 -----------------------------------------   Blood pressure 133/65, pulse 77, temperature 97.7 F (36.5 C), temperature source Oral, resp. rate 18, height 6' (1.829 m), weight 81.6 kg (180 lb), SpO2 98 %.  Patient has been evaluated by psychiatry and cleared for discharge by Dr. Weber Cooks. Patient's labs have been reviewed with no acute findings. Patient will be discharged at this time to home    Alfred Levins, [place], MD 04/09/17 [phone]

## 2017-04-09 NOTE — ED Triage Notes (Signed)
Weakness and dizzy x1 weeks , anxiety with life situations, not sleeping well.

## 2017-04-09 NOTE — Discharge Instructions (Signed)
You have been seen in the Emergency Department (ED)  today for a psychiatric complaint.  You have been evaluated by psychiatry and we believe you are safe to be discharged from the hospital.   ° °Please return to the Emergency Department (ED)  immediately if you have ANY thoughts of hurting yourself or anyone else, so that we may help you. ° °Please avoid alcohol and drug use. ° °Follow up with your doctor and/or therapist as soon as possible regarding today's ED  visit.  ° °You may call crisis hotline for [place] at [phone]. ° °

## 2017-07-22 ENCOUNTER — Observation Stay
Admission: EM | Admit: 2017-07-22 | Discharge: 2017-07-23 | Disposition: A | Discharge status: Home / Self Care | Payer: Medicare Other | Attending: Specialist | Admitting: Specialist

## 2017-07-22 ENCOUNTER — Emergency Department: Payer: Medicare Other

## 2017-07-22 ENCOUNTER — Encounter: Payer: Self-pay | Admitting: Medical Oncology

## 2017-07-22 ENCOUNTER — Other Ambulatory Visit: Payer: Self-pay

## 2017-07-22 DIAGNOSIS — N390 Urinary tract infection, site not specified: Secondary | ICD-10-CM | POA: Diagnosis not present

## 2017-07-22 DIAGNOSIS — K859 Acute pancreatitis without necrosis or infection, unspecified: Principal | ICD-10-CM | POA: Diagnosis present

## 2017-07-22 DIAGNOSIS — Z85828 Personal history of other malignant neoplasm of skin: Secondary | ICD-10-CM | POA: Diagnosis not present

## 2017-07-22 DIAGNOSIS — Z7984 Long term (current) use of oral hypoglycemic drugs: Secondary | ICD-10-CM | POA: Insufficient documentation

## 2017-07-22 DIAGNOSIS — F0390 Unspecified dementia without behavioral disturbance: Secondary | ICD-10-CM | POA: Insufficient documentation

## 2017-07-22 DIAGNOSIS — F4322 Adjustment disorder with anxiety: Secondary | ICD-10-CM | POA: Diagnosis not present

## 2017-07-22 DIAGNOSIS — Z79899 Other long term (current) drug therapy: Secondary | ICD-10-CM | POA: Diagnosis not present

## 2017-07-22 DIAGNOSIS — E785 Hyperlipidemia, unspecified: Secondary | ICD-10-CM | POA: Insufficient documentation

## 2017-07-22 DIAGNOSIS — E119 Type 2 diabetes mellitus without complications: Secondary | ICD-10-CM | POA: Diagnosis not present

## 2017-07-22 DIAGNOSIS — Z951 Presence of aortocoronary bypass graft: Secondary | ICD-10-CM | POA: Diagnosis not present

## 2017-07-22 DIAGNOSIS — K219 Gastro-esophageal reflux disease without esophagitis: Secondary | ICD-10-CM | POA: Insufficient documentation

## 2017-07-22 DIAGNOSIS — Z950 Presence of cardiac pacemaker: Secondary | ICD-10-CM | POA: Insufficient documentation

## 2017-07-22 DIAGNOSIS — I10 Essential (primary) hypertension: Secondary | ICD-10-CM | POA: Diagnosis not present

## 2017-07-22 DIAGNOSIS — K8500 Idiopathic acute pancreatitis without necrosis or infection: Secondary | ICD-10-CM

## 2017-07-22 DIAGNOSIS — N400 Enlarged prostate without lower urinary tract symptoms: Secondary | ICD-10-CM | POA: Diagnosis not present

## 2017-07-22 DIAGNOSIS — Z87442 Personal history of urinary calculi: Secondary | ICD-10-CM | POA: Diagnosis not present

## 2017-07-22 DIAGNOSIS — Z87891 Personal history of nicotine dependence: Secondary | ICD-10-CM | POA: Insufficient documentation

## 2017-07-22 LAB — HEPATIC FUNCTION PANEL
ALBUMIN: 5.2 g/dL — AB (ref 3.5–5.0)
ALT: 18 U/L (ref 17–63)
AST: 25 U/L (ref 15–41)
Alkaline Phosphatase: 52 U/L (ref 38–126)
BILIRUBIN TOTAL: 1.3 mg/dL — AB (ref 0.3–1.2)
Bilirubin, Direct: 0.1 mg/dL (ref 0.1–0.5)
Indirect Bilirubin: 1.2 mg/dL — ABNORMAL HIGH (ref 0.3–0.9)
Total Protein: 8 g/dL (ref 6.5–8.1)

## 2017-07-22 LAB — TROPONIN I

## 2017-07-22 LAB — GLUCOSE, CAPILLARY: Glucose-Capillary: 204 mg/dL — ABNORMAL HIGH (ref 65–99)

## 2017-07-22 LAB — URINALYSIS, COMPLETE (UACMP) WITH MICROSCOPIC
BACTERIA UA: NONE SEEN
Bilirubin Urine: NEGATIVE
GLUCOSE, UA: NEGATIVE mg/dL
Ketones, ur: 5 mg/dL — AB
Leukocytes, UA: NEGATIVE
Nitrite: NEGATIVE
PROTEIN: 30 mg/dL — AB
Specific Gravity, Urine: 1.024 (ref 1.005–1.030)
pH: 5 (ref 5.0–8.0)

## 2017-07-22 LAB — DIFFERENTIAL
BASOS ABS: 0.1 10*3/uL (ref 0–0.1)
BASOS PCT: 1 %
Eosinophils Absolute: 0 10*3/uL (ref 0–0.7)
Eosinophils Relative: 0 %
Lymphocytes Relative: 15 %
Lymphs Abs: 1.1 10*3/uL (ref 1.0–3.6)
MONO ABS: 0.4 10*3/uL (ref 0.2–1.0)
Monocytes Relative: 6 %
NEUTROS ABS: 5.8 10*3/uL (ref 1.4–6.5)
NEUTROS PCT: 78 %

## 2017-07-22 LAB — CBC
HCT: 46.5 % (ref 40.0–52.0)
Hemoglobin: 16.1 g/dL (ref 13.0–18.0)
MCH: 32.3 pg (ref 26.0–34.0)
MCHC: 34.7 g/dL (ref 32.0–36.0)
MCV: 93.1 fL (ref 80.0–100.0)
Platelets: 178 10*3/uL (ref 150–440)
RBC: 4.99 MIL/uL (ref 4.40–5.90)
RDW: 13.5 % (ref 11.5–14.5)
WBC: 7.7 10*3/uL (ref 3.8–10.6)

## 2017-07-22 LAB — BASIC METABOLIC PANEL
Anion gap: 14 (ref 5–15)
BUN: 16 mg/dL (ref 6–20)
CO2: 18 mmol/L — AB (ref 22–32)
Calcium: 10.4 mg/dL — ABNORMAL HIGH (ref 8.9–10.3)
Chloride: 106 mmol/L (ref 101–111)
Creatinine, Ser: 1.18 mg/dL (ref 0.61–1.24)
GFR calc Af Amer: >60 mL/min (ref >60)
GFR calc non Af Amer: 58 mL/min — ABNORMAL LOW (ref >60)
GLUCOSE: 147 mg/dL — AB (ref 65–99)
POTASSIUM: 3.9 mmol/L (ref 3.5–5.1)
Sodium: 138 mmol/L (ref 135–145)

## 2017-07-22 LAB — LIPASE, BLOOD: LIPASE: 181 U/L — AB (ref 11–51)

## 2017-07-22 MED ORDER — SODIUM CHLORIDE 0.9% FLUSH
3.0000 mL | Freq: Two times a day (BID) | INTRAVENOUS | Status: DC
  Administered 2017-07-22 – 2017-07-23 (×2): 3 mL via INTRAVENOUS

## 2017-07-22 MED ORDER — SENNOSIDES-DOCUSATE SODIUM 8.6-50 MG PO TABS: 1.0000 | ORAL_TABLET | Freq: Every evening | ORAL | Status: DC | PRN

## 2017-07-22 MED ORDER — QUINAPRIL HCL 10 MG PO TABS
20.0000 mg | ORAL_TABLET | ORAL | Status: DC
  Administered 2017-07-23: 20 mg via ORAL
  Filled 2017-07-22 (×2): qty 2

## 2017-07-22 MED ORDER — RISPERIDONE 0.5 MG PO TABS
0.5000 mg | ORAL_TABLET | Freq: Two times a day (BID) | ORAL | Status: DC
  Administered 2017-07-22 – 2017-07-23 (×2): 1 mg via ORAL
  Filled 2017-07-22 (×2): qty 2

## 2017-07-22 MED ORDER — ALLOPURINOL 100 MG PO TABS
300.0000 mg | ORAL_TABLET | Freq: Every day | ORAL | Status: DC
  Administered 2017-07-23: 300 mg via ORAL
  Filled 2017-07-22 (×2): qty 3

## 2017-07-22 MED ORDER — CEFTRIAXONE SODIUM IN DEXTROSE 20 MG/ML IV SOLN: 1.0000 g | INTRAVENOUS | Status: DC

## 2017-07-22 MED ORDER — ENOXAPARIN SODIUM 40 MG/0.4ML ~~LOC~~ SOLN
40.0000 mg | SUBCUTANEOUS | Status: DC
  Administered 2017-07-22: 40 mg via SUBCUTANEOUS
  Filled 2017-07-22: qty 0.4

## 2017-07-22 MED ORDER — ACETAMINOPHEN 650 MG RE SUPP: 650.0000 mg | Freq: Four times a day (QID) | RECTAL | Status: DC | PRN

## 2017-07-22 MED ORDER — IBUPROFEN 400 MG PO TABS: 400.0000 mg | ORAL_TABLET | Freq: Three times a day (TID) | ORAL | Status: DC | PRN

## 2017-07-22 MED ORDER — INSULIN ASPART 100 UNIT/ML ~~LOC~~ SOLN
0.0000 [IU] | Freq: Three times a day (TID) | SUBCUTANEOUS | Status: DC
  Administered 2017-07-23: 1 [IU] via SUBCUTANEOUS
  Filled 2017-07-22: qty 1

## 2017-07-22 MED ORDER — ONDANSETRON HCL 4 MG/2ML IJ SOLN: 4.0000 mg | Freq: Four times a day (QID) | INTRAMUSCULAR | Status: DC | PRN

## 2017-07-22 MED ORDER — ONDANSETRON HCL 4 MG PO TABS: 4.0000 mg | ORAL_TABLET | Freq: Four times a day (QID) | ORAL | Status: DC | PRN

## 2017-07-22 MED ORDER — ASPIRIN EC 325 MG PO TBEC
325.0000 mg | DELAYED_RELEASE_TABLET | Freq: Every day | ORAL | Status: DC
  Administered 2017-07-22 – 2017-07-23 (×2): 325 mg via ORAL
  Filled 2017-07-22 (×2): qty 1

## 2017-07-22 MED ORDER — SODIUM CHLORIDE 0.9% FLUSH: 3.0000 mL | INTRAVENOUS | Status: DC | PRN

## 2017-07-22 MED ORDER — IOPAMIDOL (ISOVUE-370) INJECTION 76%
100.0000 mL | Freq: Once | INTRAVENOUS | Status: AC | PRN
  Administered 2017-07-22: 100 mL via INTRAVENOUS

## 2017-07-22 MED ORDER — IOPAMIDOL (ISOVUE-300) INJECTION 61%: 100.0000 mL | Freq: Once | INTRAVENOUS | Status: DC | PRN

## 2017-07-22 MED ORDER — TAMSULOSIN HCL 0.4 MG PO CAPS
0.4000 mg | ORAL_CAPSULE | Freq: Every day | ORAL | Status: DC
  Administered 2017-07-22: 0.4 mg via ORAL
  Filled 2017-07-22: qty 1

## 2017-07-22 MED ORDER — SENNA 8.6 MG PO TABS
2.0000 | ORAL_TABLET | Freq: Every day | ORAL | Status: DC
  Administered 2017-07-22: 17.2 mg via ORAL
  Filled 2017-07-22: qty 2

## 2017-07-22 MED ORDER — DONEPEZIL HCL 5 MG PO TABS
5.0000 mg | ORAL_TABLET | Freq: Every day | ORAL | Status: DC
  Administered 2017-07-22: 5 mg via ORAL
  Filled 2017-07-22: qty 1

## 2017-07-22 MED ORDER — FENOFIBRATE 160 MG PO TABS
160.0000 mg | ORAL_TABLET | Freq: Every day | ORAL | Status: DC
  Administered 2017-07-23: 160 mg via ORAL
  Filled 2017-07-22 (×2): qty 1

## 2017-07-22 MED ORDER — ACETAMINOPHEN 325 MG PO TABS
650.0000 mg | ORAL_TABLET | Freq: Four times a day (QID) | ORAL | Status: DC | PRN
  Administered 2017-07-23: 650 mg via ORAL
  Filled 2017-07-22: qty 2

## 2017-07-22 MED ORDER — ALBUTEROL SULFATE (2.5 MG/3ML) 0.083% IN NEBU: 2.5000 mg | INHALATION_SOLUTION | RESPIRATORY_TRACT | Status: DC | PRN

## 2017-07-22 MED ORDER — BISACODYL 5 MG PO TBEC: 5.0000 mg | DELAYED_RELEASE_TABLET | Freq: Every day | ORAL | Status: DC | PRN

## 2017-07-22 MED ORDER — ROSUVASTATIN CALCIUM 10 MG PO TABS
10.0000 mg | ORAL_TABLET | Freq: Every day | ORAL | Status: DC
  Administered 2017-07-22: 10 mg via ORAL
  Filled 2017-07-22: qty 1

## 2017-07-22 MED ORDER — DEXTROSE 5 % IV SOLN
1.0000 g | INTRAVENOUS | Status: DC
  Filled 2017-07-22: qty 10

## 2017-07-22 MED ORDER — INSULIN ASPART 100 UNIT/ML ~~LOC~~ SOLN: 0.0000 [IU] | Freq: Every day | SUBCUTANEOUS | Status: DC

## 2017-07-22 MED ORDER — CEFTRIAXONE SODIUM IN DEXTROSE 20 MG/ML IV SOLN
1.0000 g | Freq: Once | INTRAVENOUS | Status: AC
  Administered 2017-07-22: 1 g via INTRAVENOUS
  Filled 2017-07-22: qty 50

## 2017-07-22 MED ORDER — HYDROCODONE-ACETAMINOPHEN 5-325 MG PO TABS: 1.0000 | ORAL_TABLET | ORAL | Status: DC | PRN

## 2017-07-22 MED ORDER — HYDROXYZINE HCL 10 MG PO TABS
10.0000 mg | ORAL_TABLET | Freq: Every evening | ORAL | Status: DC | PRN
  Filled 2017-07-22: qty 2

## 2017-07-22 MED ORDER — SODIUM CHLORIDE 0.9 % IV SOLN
250.0000 mL | INTRAVENOUS | Status: DC | PRN
Start: 2017-07-22 — End: 2017-07-23

## 2017-07-22 MED ORDER — CITALOPRAM HYDROBROMIDE 20 MG PO TABS
40.0000 mg | ORAL_TABLET | Freq: Every day | ORAL | Status: DC
  Administered 2017-07-23: 40 mg via ORAL
  Filled 2017-07-22: qty 2

## 2017-07-22 NOTE — ED Notes (Signed)
Patient transported to 208

## 2017-07-22 NOTE — ED Triage Notes (Signed)
Pt here via ems from home with reports that for 10 days he has been having chills, feeling weak and fatigued. Pt denies pain. A/O x 4.

## 2017-07-22 NOTE — ED Notes (Signed)
Report given to Paulette, RN 
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
## 2017-07-22 NOTE — Progress Notes (Signed)
Pharmacy Antibiotic Note  Dylan Santiago is a 77 y.o. male admitted on 07/22/2017 with UTI.  Pharmacy has been consulted for Ceftriaxone dosing.  Hx kidney stones w/ urinary frequency. Hx DM, pacemaker  Plan: Ceftriaxone 1 gram IV q24h  Height: 5\' 10"  (177.8 cm) Weight: 185 lb (83.9 kg) IBW/kg (Calculated) : 73  Temp (24hrs), Avg:97.7 F (36.5 C), Min:97.6 F (36.4 C), Max:97.7 F (36.5 C)  Recent Labs  Lab 07/22/17 1100  WBC 7.7  CREATININE 1.18    Estimated Creatinine Clearance: 54.1 mL/min (by C-G formula based on SCr of 1.18 mg/dL).    Allergies  Allergen Reactions  . Seasonal Ic [Cholestatin]     Uses allergy medication as needed.    Antimicrobials this admission: CTX 1/15 >>       >>    Dose adjustments this admission:    Microbiology results:   BCx:   1/15 UCx: pending    Sputum:      MRSA PCR:    Thank you for allowing pharmacy to be a part of this patient's care.  Ludell Zacarias A 07/22/2017 7:43 PM

## 2017-07-22 NOTE — ED Notes (Signed)
Pt stating that he has been running a low grade fever for the last 4 days (per wife she is stating that it has been the last 10 days). Pt stating that he also has had n/v and has had limited intake for the last 48 hours. Pt's mucous membranes are moist at this time. Pt has had no emesis since arrival. Pt afebrile since arrival. Pt is asking if he can have something to drink and nurse has let him know that she would speak with provider. Pt denying any diarrhea. Pt stating "I may be constipated."
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
## 2017-07-22 NOTE — ED Notes (Signed)
Patient back in room after CT

## 2017-07-23 DIAGNOSIS — K859 Acute pancreatitis without necrosis or infection, unspecified: Secondary | ICD-10-CM | POA: Diagnosis not present

## 2017-07-23 LAB — GLUCOSE, CAPILLARY
GLUCOSE-CAPILLARY: 121 mg/dL — AB (ref 65–99)
GLUCOSE-CAPILLARY: 152 mg/dL — AB (ref 65–99)

## 2017-07-23 LAB — HEMOGLOBIN A1C
Hgb A1c MFr Bld: 6.3 % — ABNORMAL HIGH (ref 4.8–5.6)
MEAN PLASMA GLUCOSE: 134.11 mg/dL

## 2017-07-23 LAB — LIPASE, BLOOD: Lipase: 60 U/L — ABNORMAL HIGH (ref 11–51)

## 2017-07-23 MED ORDER — CEFUROXIME AXETIL 250 MG PO TABS: 250.0000 mg | ORAL_TABLET | Freq: Two times a day (BID) | ORAL | 0 refills | Status: AC

## 2017-07-23 NOTE — Discharge Summary (Signed)
[facility] NAME: Dylan Santiago    MR#:  [id number]  DATE OF BIRTH:  [birth year]  DATE OF ADMISSION:  07/22/2017 ADMITTING PHYSICIAN: Demetrios Loll, MD  DATE OF DISCHARGE: 07/23/2017 12:53 PM  PRIMARY CARE PHYSICIAN: Demetrios Isaacs, MD    ADMISSION DIAGNOSIS:  Idiopathic acute pancreatitis without infection or necrosis [K85.00]  DISCHARGE DIAGNOSIS:  Active Problems:   Acute pancreatitis   SECONDARY DIAGNOSIS:   Past Medical History:  Diagnosis Date  . Arthritis   . Cancer ([place])    skin cancer (right ear)  . Diabetes mellitus without complication ([place])   . Family history of adverse reaction to anesthesia    mom-n/v  . GERD (gastroesophageal reflux disease)   . History of kidney stones   . Hypertension   . Presence of permanent cardiac pacemaker 2017   second pacemaker placed 3 months ago    HOSPITAL COURSE:   77 year old male with past medical history of diabetes, hypertension, GERD, history of renal stones, status post pacemaker, osteoarthritis of presented to the hospital due to fever, chills, with a mildly elevated lipase.  1. Suspected acute pancreatitis-patient presented to the hospital with some chills, fever and elevated lipase consistent with mild pancreatitis. Patient's CT scan of the abdomen and pelvis although was negative for acute pathology. -Patient was treated supportively with IV fluids, antiemetics. He has clinically improved. He is not tolerating a regular diet well. His lipase has trended down. He denied any history of alcohol abuse. His LFTs are stable.  2. Urinary tract infection-patient had some fevers and chills with some mild pyuria. He was nitrites and leukocyte esterase negative on his urinalysis. He was empirically given ceftriaxone in the hospital and I discharge him on 3 more days of oral Ceftin.  3. Diabetes type 2 without complication-on the hospital patient was in sliding scale insulin. He  will resume his Januvia, Glucotrol upon discharge.  4.  hypertension-patient will continue his quinapril.  5. BPH-patient will continue his Flomax.  6. Hyperlipidemia-patient will continue his Crestor.  7. Dementia-patient will continue his Aricept.   DISCHARGE CONDITIONS:   Stable  CONSULTS OBTAINED:    DRUG ALLERGIES:   Allergies  Allergen Reactions  . Seasonal Ic [Cholestatin]     Uses allergy medication as needed.    DISCHARGE MEDICATIONS:   Allergies as of 07/23/2017      Reactions   Seasonal Ic [cholestatin]    Uses allergy medication as needed.      Medication List    TAKE these medications   acetaminophen 650 MG CR tablet Commonly known as:  TYLENOL Take 650 mg by mouth every 8 (eight) hours as needed for pain or fever.   allopurinol 300 MG tablet Commonly known as:  ZYLOPRIM Take 300 mg by mouth daily.   aspirin EC 325 MG tablet Take 325 mg by mouth daily.   cefUROXime 250 MG tablet Commonly known as:  CEFTIN Take 1 tablet (250 mg total) by mouth 2 (two) times daily with a meal for 3 days.   citalopram 40 MG tablet Commonly known as:  CELEXA Take 40 mg by mouth daily.   donepezil 5 MG tablet Commonly known as:  ARICEPT Take 5 mg by mouth at bedtime.   fenofibrate micronized 134 MG capsule Commonly known as:  LOFIBRA Take 134 mg by mouth daily.   GAVISCON EXTRA STRENGTH 160-105 MG Chew Generic drug:  Alum Hydroxide-Mag Carbonate Chew 2 tablets by mouth 2 (two)  times daily as needed (heartburn).   glipiZIDE 2.5 MG 24 hr tablet Commonly known as:  GLUCOTROL XL Take 5 mg by mouth 2 (two) times daily.   hydrOXYzine 10 MG tablet Commonly known as:  ATARAX/VISTARIL Take 10-20 mg by mouth at bedtime as needed for anxiety (sleep).   ibuprofen 200 MG tablet Commonly known as:  ADVIL,MOTRIN Take 400-800 mg by mouth every 8 (eight) hours as needed for fever, mild pain or moderate pain.   quinapril 20 MG tablet Commonly known as:   ACCUPRIL Take 20 mg by mouth every morning.   risperiDONE 0.5 MG tablet Commonly known as:  RISPERDAL Take 0.5-1 mg by mouth 2 (two) times daily.   rosuvastatin 10 MG tablet Commonly known as:  CRESTOR Take 10 mg by mouth at bedtime.   senna 8.6 MG tablet Commonly known as:  SENOKOT Take 2 tablets by mouth at bedtime.   sitaGLIPtin 100 MG tablet Commonly known as:  JANUVIA Take 100 mg by mouth daily.   tamsulosin 0.4 MG Caps capsule Commonly known as:  FLOMAX Take 0.4 mg by mouth at bedtime.         DISCHARGE INSTRUCTIONS:   DIET:  Cardiac diet and Diabetic diet  DISCHARGE CONDITION:  Stable  ACTIVITY:  Activity as tolerated  OXYGEN:  Home Oxygen: No.   Oxygen Delivery: room air  DISCHARGE LOCATION:  home   If you experience worsening of your admission symptoms, develop shortness of breath, life threatening emergency, suicidal or homicidal thoughts you must seek medical attention immediately by calling 911 or calling your MD immediately  if symptoms less severe.  You Must read complete instructions/literature along with all the possible adverse reactions/side effects for all the Medicines you take and that have been prescribed to you. Take any new Medicines after you have completely understood and accpet all the possible adverse reactions/side effects.   Please note  You were cared for by a hospitalist during your hospital stay. If you have any questions about your discharge medications or the care you received while you were in the hospital after you are discharged, you can call the unit and asked to speak with the hospitalist on call if the hospitalist that took care of you is not available. Once you are discharged, your primary care physician will handle any further medical issues. Please note that NO REFILLS for any discharge medications will be authorized once you are discharged, as it is imperative that you return to your primary care physician (or establish  a relationship with a primary care physician if you do not have one) for your aftercare needs so that they can reassess your need for medications and monitor your lab values.     Today   No abdominal pain, N/V. No fever, chills and cultures (-). Feels better and will d/c home today.   VITAL SIGNS:  Blood pressure 109/71, pulse 62, temperature (!) 97.4 F (36.3 C), temperature source Oral, resp. rate 16, height 5\' 10"  (1.778 m), weight 76.9 kg (169 lb 8 oz), SpO2 98 %.  I/O:    Intake/Output Summary (Last 24 hours) at 07/23/2017 1617 Last data filed at 07/23/2017 1030 Gross per 24 hour  Intake 243 ml  Output 1 ml  Net 242 ml    PHYSICAL EXAMINATION:  GENERAL:  77 y.o.-year-old patient lying in the bed with no acute distress.  EYES: Pupils equal, round, reactive to light and accommodation. No scleral icterus. Extraocular muscles intact.  HEENT: Head atraumatic, normocephalic. Oropharynx  and nasopharynx clear.  NECK:  Supple, no jugular venous distention. No thyroid enlargement, no tenderness.  LUNGS: Normal breath sounds bilaterally, no wheezing, rales,rhonchi. No use of accessory muscles of respiration.  CARDIOVASCULAR: S1, S2 normal. No murmurs, rubs, or gallops.  ABDOMEN: Soft, non-tender, non-distended. Bowel sounds present. No organomegaly or mass.  EXTREMITIES: No pedal edema, cyanosis, or clubbing.  NEUROLOGIC: Cranial nerves II through XII are intact. No focal motor or sensory defecits b/l.  PSYCHIATRIC: The patient is alert and oriented x 3. Good affect.  SKIN: No obvious rash, lesion, or ulcer.   DATA REVIEW:   CBC Recent Labs  Lab 07/22/17 1100  WBC 7.7  HGB 16.1  HCT 46.5  PLT 178    Chemistries  Recent Labs  Lab 07/22/17 1100  NA 138  K 3.9  CL 106  CO2 18*  GLUCOSE 147*  BUN 16  CREATININE 1.18  CALCIUM 10.4*  AST 25  ALT 18  ALKPHOS 52  BILITOT 1.3*    Cardiac Enzymes Recent Labs  Lab 07/22/17 1100  TROPONINI <0.03    Microbiology  Results  No results found for this or any previous visit.  RADIOLOGY:  Ct Abdomen Pelvis W Contrast  Result Date: 07/22/2017 CLINICAL DATA:  Low grade fever for 4 days. Nausea and vomiting and limited po intake for 2 days. Elevated lipase. EXAM: CT ABDOMEN AND PELVIS WITH CONTRAST TECHNIQUE: Multidetector CT imaging of the abdomen and pelvis was performed using the standard protocol following bolus administration of intravenous contrast. CONTRAST:  100 ml ISOVUE-370 IOPAMIDOL (ISOVUE-370) INJECTION 76% COMPARISON:  None. FINDINGS: Lower chest: The lung bases appear emphysematous. There is mild dependent atelectatic change. No pleural or pericardial effusion. Hepatobiliary: The gallbladder is unremarkable. There is diffuse fatty infiltration of the liver. No focal liver lesion. Biliary tree appears normal. Pancreas: A single punctate calcification in the head of the pancreas is consistent with old inflammatory change. The pancreas is otherwise unremarkable without mass, surrounding inflammatory change, pancreatic duct dilatation or adjacent fluid collection. Spleen: Normal in size without focal abnormality. Adrenals/Urinary Tract: A few small hypoattenuating lesions are seen in both kidneys which likely represent cysts. The kidneys otherwise appear normal. Ureters and urinary bladder are unremarkable. The adrenal glands are normal. Stomach/Bowel: Stomach is within normal limits. Appendix appears normal. No evidence of bowel wall thickening, distention, or inflammatory changes. Vascular/Lymphatic: No significant vascular findings are present. No enlarged abdominal or pelvic lymph nodes. Reproductive: The prostate is mildly enlarged. Other: Small fat containing left inguinal hernia is noted. The patient also has a tiny fat containing umbilical hernia. No ascites. Musculoskeletal: No lytic or sclerotic lesion. Lower lumbar degenerative change noted. IMPRESSION: No acute abnormality abdomen or pelvis. No evidence  of acute pancreatitis by CT. Fatty infiltration of the liver. Emphysema. Atherosclerosis. Small fat containing left inguinal hernia and tiny fat containing umbilical hernia. Mild prostatomegaly. Electronically Signed   By: Inge Rise M.D.   On: 07/22/2017 16:38      Management plans discussed with the patient, family and they are in agreement.  CODE STATUS:  Code Status History    Date Active Date Inactive Code Status Order ID Comments User Context   07/22/2017 20:59 07/23/2017 15:58 Full Code [id number]  Demetrios Loll, MD Inpatient    Advance Directive Documentation     Most Recent Value  Type of Advance Directive  Living will  Pre-existing out of facility DNR order (yellow form or pink MOST form)  No data  "MOST" Form in  Place?  No data      TOTAL TIME TAKING CARE OF THIS PATIENT: 40 minutes.    Henreitta Leber M.D on 07/23/2017 at 4:17 PM  Between 7am to 6pm - Pager - [phone]  After 6pm go to [email or web address] - [employer] [facility]  Office  [phone]  CC: Primary care physician; Demetrios Isaacs, MD

## 2017-07-23 NOTE — Progress Notes (Signed)
IV was removed. Discharge instructions, follow-up appointments, and prescriptions were provided to the pt and son at bedside. The pt was taken downstairs via wheelchair by [employer].

## 2017-07-23 NOTE — Progress Notes (Signed)
Initial Nutrition Assessment  DOCUMENTATION CODES:   Non-severe (moderate) malnutrition in context of chronic illness  INTERVENTION:  Premier Protein BID, each supplement provides 160 calories and 30gm protein   NUTRITION DIAGNOSIS:   Moderate Malnutrition related to chronic illness as evidenced by moderate muscle depletion, severe fat depletion, moderate fat depletion  GOAL:   Patient will meet greater than or equal to 90% of their needs  MONITOR:   PO intake, Supplement acceptance, I & O's, Labs, Weight trends, Skin  REASON FOR ASSESSMENT:   Malnutrition Screening Tool    ASSESSMENT:   Dylan Santiago  is a 77 y.o. male with a known history of hypertension, diabetes, GERD, kidney stone and CAD, status post CABG and pacemaker presents with generalized weakness and poor appetite, lipase found elevated at 187, acute mild pancreatitis, placed on soft diet  Spoke with Dylan Santiago at bedside. He reports having a temperature for 10 days, poor PO intake for 4 days consuming mostly soup. Prior to that, he was eating 2 meals a day, mostly eating out with his wife who has Parkinson's. States a UBW of 180-190 pounds, with recent weight loss but he is unsure of the amount. Per chart, he was 180 pounds in October, indicating an 11 pound/6.1% insignificant weight loss over 2 months PO this morning was 75% per chart, patient states he ate all of the food he received. Normally drinks low carb, high protein atkins drink at home. Will provide similar equivalent here.  Labs reviewed  Medications reviewed and include:  Insulin     NUTRITION - FOCUSED PHYSICAL EXAM:    Most Recent Value  Orbital Region  Moderate depletion  Upper Arm Region  Moderate depletion  Thoracic and Lumbar Region  Moderate depletion  Buccal Region  Moderate depletion  Temple Region  Moderate depletion  Clavicle Bone Region  Moderate depletion  Clavicle and Acromion Bone Region  Moderate depletion  Scapular Bone  Region  Moderate depletion  Dorsal Hand  Moderate depletion  Patellar Region  Severe depletion  Anterior Thigh Region  Severe depletion  Posterior Calf Region  Severe depletion  Edema (RD Assessment)  None  Hair  Reviewed  Eyes  Reviewed  Mouth  Reviewed  Skin  Reviewed  Nails  Reviewed       Diet Order:  DIET SOFT Room service appropriate? Yes; Fluid consistency: Thin Diet - low sodium heart healthy  EDUCATION NEEDS:   Education needs have been addressed  Skin:  Skin Assessment: Reviewed RN Assessment  Last BM:  07/22/2017 (Type 4)  Height:   Ht Readings from Last 1 Encounters:  07/22/17 5\' 10"  (1.778 m)    Weight:   Wt Readings from Last 1 Encounters:  07/22/17 169 lb 8 oz (76.9 kg)    Ideal Body Weight:  75.45 kg  BMI:  Body mass index is 24.32 kg/m.  Estimated Nutritional Needs:   Kcal:  6387-5643 calories (MSJ x1.2-1.3)  Protein:  108-123 grams (1.4-1.6g/kg)  Fluid:  1.8-2L  Satira Anis. Bevan Disney, MS, RD LDN Inpatient Clinical Dietitian Pager [phone]

## 2017-07-24 LAB — URINE CULTURE: Culture: <10000 — AB

## 2017-09-30 ENCOUNTER — Emergency Department
Admission: EM | Admit: 2017-09-30 | Discharge: 2017-09-30 | Disposition: A | Discharge status: Home / Self Care | Payer: Medicare Other | Attending: Emergency Medicine | Admitting: Emergency Medicine

## 2017-09-30 ENCOUNTER — Encounter: Payer: Self-pay | Admitting: Emergency Medicine

## 2017-09-30 ENCOUNTER — Other Ambulatory Visit: Payer: Self-pay

## 2017-09-30 ENCOUNTER — Emergency Department: Payer: Medicare Other

## 2017-09-30 DIAGNOSIS — Z7982 Long term (current) use of aspirin: Secondary | ICD-10-CM | POA: Diagnosis not present

## 2017-09-30 DIAGNOSIS — Z87891 Personal history of nicotine dependence: Secondary | ICD-10-CM | POA: Insufficient documentation

## 2017-09-30 DIAGNOSIS — Z85828 Personal history of other malignant neoplasm of skin: Secondary | ICD-10-CM | POA: Diagnosis not present

## 2017-09-30 DIAGNOSIS — N390 Urinary tract infection, site not specified: Secondary | ICD-10-CM | POA: Insufficient documentation

## 2017-09-30 DIAGNOSIS — E119 Type 2 diabetes mellitus without complications: Secondary | ICD-10-CM | POA: Insufficient documentation

## 2017-09-30 DIAGNOSIS — Z950 Presence of cardiac pacemaker: Secondary | ICD-10-CM | POA: Insufficient documentation

## 2017-09-30 DIAGNOSIS — I10 Essential (primary) hypertension: Secondary | ICD-10-CM | POA: Diagnosis not present

## 2017-09-30 DIAGNOSIS — Z7984 Long term (current) use of oral hypoglycemic drugs: Secondary | ICD-10-CM | POA: Insufficient documentation

## 2017-09-30 DIAGNOSIS — R42 Dizziness and giddiness: Secondary | ICD-10-CM

## 2017-09-30 DIAGNOSIS — E860 Dehydration: Secondary | ICD-10-CM | POA: Insufficient documentation

## 2017-09-30 DIAGNOSIS — F4322 Adjustment disorder with anxiety: Secondary | ICD-10-CM | POA: Insufficient documentation

## 2017-09-30 DIAGNOSIS — Z951 Presence of aortocoronary bypass graft: Secondary | ICD-10-CM | POA: Insufficient documentation

## 2017-09-30 DIAGNOSIS — Z79899 Other long term (current) drug therapy: Secondary | ICD-10-CM | POA: Insufficient documentation

## 2017-09-30 DIAGNOSIS — R1031 Right lower quadrant pain: Secondary | ICD-10-CM | POA: Insufficient documentation

## 2017-09-30 LAB — CBC
HEMATOCRIT: 44.1 % (ref 40.0–52.0)
HEMOGLOBIN: 15 g/dL (ref 13.0–18.0)
MCH: 31.8 pg (ref 26.0–34.0)
MCHC: 34 g/dL (ref 32.0–36.0)
MCV: 93.6 fL (ref 80.0–100.0)
Platelets: 156 10*3/uL (ref 150–440)
RBC: 4.72 MIL/uL (ref 4.40–5.90)
RDW: 14.6 % — ABNORMAL HIGH (ref 11.5–14.5)
WBC: 8.5 10*3/uL (ref 3.8–10.6)

## 2017-09-30 LAB — BASIC METABOLIC PANEL
ANION GAP: 11 (ref 5–15)
BUN: 14 mg/dL (ref 6–20)
CHLORIDE: 105 mmol/L (ref 101–111)
CO2: 19 mmol/L — AB (ref 22–32)
Calcium: 10.1 mg/dL (ref 8.9–10.3)
Creatinine, Ser: 1.02 mg/dL (ref 0.61–1.24)
GFR calc non Af Amer: >60 mL/min (ref >60)
Glucose, Bld: 192 mg/dL — ABNORMAL HIGH (ref 65–99)
POTASSIUM: 3.8 mmol/L (ref 3.5–5.1)
Sodium: 135 mmol/L (ref 135–145)

## 2017-09-30 LAB — URINALYSIS, COMPLETE (UACMP) WITH MICROSCOPIC
Bilirubin Urine: NEGATIVE
GLUCOSE, UA: NEGATIVE mg/dL
KETONES UR: NEGATIVE mg/dL
Leukocytes, UA: NEGATIVE
Nitrite: NEGATIVE
PROTEIN: 30 mg/dL — AB
Specific Gravity, Urine: 1.014 (ref 1.005–1.030)
Squamous Epithelial / LPF: NONE SEEN
pH: 5 (ref 5.0–8.0)

## 2017-09-30 LAB — TROPONIN I
Troponin I: <0.03 ng/mL (ref <0.03)
Troponin I: <0.03 ng/mL (ref <0.03)

## 2017-09-30 MED ORDER — CEPHALEXIN 500 MG PO CAPS
500.0000 mg | ORAL_CAPSULE | Freq: Once | ORAL | Status: AC
  Administered 2017-09-30: 500 mg via ORAL
  Filled 2017-09-30: qty 1

## 2017-09-30 MED ORDER — ACETAMINOPHEN 500 MG PO TABS
1000.0000 mg | ORAL_TABLET | Freq: Once | ORAL | Status: AC
  Administered 2017-09-30: 1000 mg via ORAL
  Filled 2017-09-30: qty 2

## 2017-09-30 MED ORDER — CEPHALEXIN 500 MG PO CAPS: 500.0000 mg | ORAL_CAPSULE | Freq: Three times a day (TID) | ORAL | 0 refills | Status: AC

## 2017-09-30 MED ORDER — SODIUM CHLORIDE 0.9 % IV BOLUS
1000.0000 mL | Freq: Once | INTRAVENOUS | Status: AC
  Administered 2017-09-30: 1000 mL via INTRAVENOUS
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
## 2017-09-30 NOTE — Discharge Instructions (Addendum)
Please seek medical attention for any high fevers, chest pain, shortness of breath, change in behavior, persistent vomiting, bloody stool or any other new or concerning symptoms.  

## 2017-09-30 NOTE — ED Triage Notes (Addendum)
Pt in via POV with complaints of dizziness x one week.  Pt reports feeling as if his pacemaker shocked him last night.  Pt appears dyspneic at rest; pt states, "I just feel weak."  Vitals WDL.

## 2017-09-30 NOTE — ED Notes (Signed)
Pacemaker interrogated. 

## 2017-10-02 LAB — URINE CULTURE: Culture: NO GROWTH

## 2018-03-31 ENCOUNTER — Encounter: Payer: Self-pay | Admitting: *Deleted

## 2018-04-01 ENCOUNTER — Encounter
Admission: RE | Disposition: A | Discharge status: Home / Self Care | Payer: Self-pay | Source: Ambulatory Visit | Attending: Internal Medicine

## 2018-04-01 ENCOUNTER — Ambulatory Visit
Admission: RE | Admit: 2018-04-01 | Discharge: 2018-04-01 | Disposition: A | Discharge status: Home / Self Care | Payer: Medicare Other | Source: Ambulatory Visit | Attending: Internal Medicine | Admitting: Internal Medicine

## 2018-04-01 ENCOUNTER — Ambulatory Visit: Payer: Medicare Other | Admitting: Anesthesiology

## 2018-04-01 DIAGNOSIS — Z7982 Long term (current) use of aspirin: Secondary | ICD-10-CM | POA: Insufficient documentation

## 2018-04-01 DIAGNOSIS — Z85828 Personal history of other malignant neoplasm of skin: Secondary | ICD-10-CM | POA: Insufficient documentation

## 2018-04-01 DIAGNOSIS — K3189 Other diseases of stomach and duodenum: Secondary | ICD-10-CM | POA: Diagnosis not present

## 2018-04-01 DIAGNOSIS — Z79890 Hormone replacement therapy (postmenopausal): Secondary | ICD-10-CM | POA: Insufficient documentation

## 2018-04-01 DIAGNOSIS — E119 Type 2 diabetes mellitus without complications: Secondary | ICD-10-CM | POA: Insufficient documentation

## 2018-04-01 DIAGNOSIS — Z87891 Personal history of nicotine dependence: Secondary | ICD-10-CM | POA: Insufficient documentation

## 2018-04-01 DIAGNOSIS — Z951 Presence of aortocoronary bypass graft: Secondary | ICD-10-CM | POA: Diagnosis not present

## 2018-04-01 DIAGNOSIS — I10 Essential (primary) hypertension: Secondary | ICD-10-CM | POA: Diagnosis not present

## 2018-04-01 DIAGNOSIS — Z7984 Long term (current) use of oral hypoglycemic drugs: Secondary | ICD-10-CM | POA: Insufficient documentation

## 2018-04-01 DIAGNOSIS — N400 Enlarged prostate without lower urinary tract symptoms: Secondary | ICD-10-CM | POA: Insufficient documentation

## 2018-04-01 DIAGNOSIS — Z6824 Body mass index (BMI) 24.0-24.9, adult: Secondary | ICD-10-CM | POA: Insufficient documentation

## 2018-04-01 DIAGNOSIS — Z79899 Other long term (current) drug therapy: Secondary | ICD-10-CM | POA: Diagnosis not present

## 2018-04-01 DIAGNOSIS — K219 Gastro-esophageal reflux disease without esophagitis: Secondary | ICD-10-CM | POA: Insufficient documentation

## 2018-04-01 DIAGNOSIS — K222 Esophageal obstruction: Secondary | ICD-10-CM | POA: Insufficient documentation

## 2018-04-01 DIAGNOSIS — D500 Iron deficiency anemia secondary to blood loss (chronic): Secondary | ICD-10-CM | POA: Diagnosis not present

## 2018-04-01 DIAGNOSIS — I2510 Atherosclerotic heart disease of native coronary artery without angina pectoris: Secondary | ICD-10-CM | POA: Diagnosis not present

## 2018-04-01 DIAGNOSIS — R634 Abnormal weight loss: Secondary | ICD-10-CM | POA: Insufficient documentation

## 2018-04-01 DIAGNOSIS — Z950 Presence of cardiac pacemaker: Secondary | ICD-10-CM | POA: Insufficient documentation

## 2018-04-01 HISTORY — DX: Benign prostatic hyperplasia without lower urinary tract symptoms: N40.0

## 2018-04-01 HISTORY — PX: ESOPHAGOGASTRODUODENOSCOPY (EGD) WITH PROPOFOL: SHX5813

## 2018-04-01 HISTORY — PX: COLONOSCOPY WITH PROPOFOL: SHX5780

## 2018-04-01 HISTORY — DX: Atherosclerotic heart disease of native coronary artery without angina pectoris: I25.10

## 2018-04-01 LAB — GLUCOSE, CAPILLARY: Glucose-Capillary: 137 mg/dL — ABNORMAL HIGH (ref 70–99)

## 2018-04-01 SURGERY — ESOPHAGOGASTRODUODENOSCOPY (EGD) WITH PROPOFOL
Anesthesia: General

## 2018-04-01 MED ORDER — FENTANYL CITRATE (PF) 100 MCG/2ML IJ SOLN
INTRAMUSCULAR | Status: AC
Start: 2018-04-01 — End: 2018-04-01
  Filled 2018-04-01: qty 2

## 2018-04-01 MED ORDER — PROPOFOL 500 MG/50ML IV EMUL
INTRAVENOUS | Status: DC | PRN
  Administered 2018-04-01: 160 ug/kg/min via INTRAVENOUS

## 2018-04-01 MED ORDER — LIDOCAINE HCL (PF) 2 % IJ SOLN
INTRAMUSCULAR | Status: AC
  Filled 2018-04-01: qty 10

## 2018-04-01 MED ORDER — SODIUM CHLORIDE 0.9 % IV SOLN
INTRAVENOUS | Status: DC
  Administered 2018-04-01 (×2): via INTRAVENOUS

## 2018-04-01 MED ORDER — FENTANYL CITRATE (PF) 100 MCG/2ML IJ SOLN
INTRAMUSCULAR | Status: DC | PRN
  Administered 2018-04-01 (×2): 50 ug via INTRAVENOUS

## 2018-04-01 MED ORDER — PROPOFOL 10 MG/ML IV BOLUS
INTRAVENOUS | Status: DC | PRN
  Administered 2018-04-01: 100 mg via INTRAVENOUS

## 2018-04-01 MED ORDER — PROPOFOL 500 MG/50ML IV EMUL
INTRAVENOUS | Status: AC
  Filled 2018-04-01: qty 50

## 2018-04-01 MED ORDER — EPHEDRINE SULFATE 50 MG/ML IJ SOLN
INTRAMUSCULAR | Status: DC | PRN
  Administered 2018-04-01: 10 mg via INTRAVENOUS

## 2018-04-01 MED ORDER — LIDOCAINE 2% (20 MG/ML) 5 ML SYRINGE
INTRAMUSCULAR | Status: DC | PRN
  Administered 2018-04-01: 30 mg via INTRAVENOUS

## 2018-04-01 NOTE — Anesthesia Post-op Follow-up Note (Signed)
Anesthesia QCDR form completed.        

## 2018-04-01 NOTE — Anesthesia Preprocedure Evaluation (Addendum)
Anesthesia Evaluation  Patient identified by MRN, date of birth, ID band Patient awake    Reviewed: Allergy & Precautions, H&P , NPO status , Patient's Chart, lab work & pertinent test results, reviewed documented beta blocker date and time   History of Anesthesia Complications (+) Family history of anesthesia reaction and history of anesthetic complications  Airway Mallampati: I  TM Distance: >3 FB Neck ROM: full    Dental  (+) Caps, Missing   Pulmonary neg sleep apnea, neg COPD, neg recent URI, former smoker,           Cardiovascular Exercise Tolerance: Good hypertension, (-) angina+ CAD and + CABG  (-) Past MI and (-) Cardiac Stents + dysrhythmias + pacemaker + Valvular Problems/Murmurs      Neuro/Psych PSYCHIATRIC DISORDERS Depression negative neurological ROS     GI/Hepatic Neg liver ROS, GERD  ,  Endo/Other  diabetes  Renal/GU Renal disease (kidney stones)  negative genitourinary   Musculoskeletal   Abdominal   Peds  Hematology negative hematology ROS (+)   Anesthesia Other Findings Past Medical History: No date: Arthritis No date: Cancer ([place])     Comment: skin cancer (right ear) No date: Diabetes mellitus without complication (HCC) No date: Family history of adverse reaction to anesthes*     Comment: mom-n/v No date: GERD (gastroesophageal reflux disease) No date: History of kidney stones No date: Hypertension 2017: Presence of permanent cardiac pacemaker     Comment: second pacemaker placed 3 months ago   Reproductive/Obstetrics negative OB ROS                             Anesthesia Physical  Anesthesia Plan  ASA: III  Anesthesia Plan: General   Post-op Pain Management:    Induction: Intravenous  PONV Risk Score and Plan: 1 and Propofol infusion and TIVA  Airway Management Planned: Natural Airway and Nasal Cannula  Additional Equipment:   Intra-op Plan:    Post-operative Plan:   Informed Consent: I have reviewed the patients History and Physical, chart, labs and discussed the procedure including the risks, benefits and alternatives for the proposed anesthesia with the patient or authorized representative who has indicated his/her understanding and acceptance.   Dental Advisory Given  Plan Discussed with: Anesthesiologist, CRNA and Surgeon  Anesthesia Plan Comments:         Anesthesia Quick Evaluation

## 2018-04-01 NOTE — Interval H&P Note (Signed)
History and Physical Interval Note:  04/01/2018 8:53 AM  Dylan Santiago  has presented today for surgery, with the diagnosis of ANEMIA  The various methods of treatment have been discussed with the patient and family. After consideration of risks, benefits and other options for treatment, the patient has consented to  Procedure(s): ESOPHAGOGASTRODUODENOSCOPY (EGD) WITH PROPOFOL (N/A) COLONOSCOPY WITH PROPOFOL (N/A) as a surgical intervention .  The patient's history has been reviewed, patient examined, no change in status, stable for surgery.  I have reviewed the patient's chart and labs.  Questions were answered to the patient's satisfaction.     [place], [place]

## 2018-04-01 NOTE — Op Note (Signed)
[facility] Gastroenterology Patient Name: Dylan Santiago Procedure Date: 04/01/2018 9:37 AM MRN: [id number] Account #: [id number] Date of Birth: 1940/09/18 Admit Type: Outpatient Age: 77 Room: [facility] ENDO ROOM 4 Gender: Male Note Status: Finalized Procedure:            Colonoscopy Indications:          Iron deficiency anemia secondary to chronic blood loss,                        Weight loss Providers:            Benay Pike. Alice Reichert MD, MD Referring MD:         Tracie Harrier, MD (Referring MD) Medicines:            Propofol per Anesthesia Complications:        No immediate complications. Procedure:            Pre-Anesthesia Assessment:                       - The risks and benefits of the procedure and the                        sedation options and risks were discussed with the                        patient. All questions were answered and informed                        consent was obtained.                       - Patient identification and proposed procedure were                        verified prior to the procedure by the nurse. The                        procedure was verified in the procedure room.                       - ASA Grade Assessment: III - A patient with severe                        systemic disease.                       - After reviewing the risks and benefits, the patient                        was deemed in satisfactory condition to undergo the                        procedure.                       After obtaining informed consent, the colonoscope was                        passed under direct vision. Throughout the procedure,  the patient's blood pressure, pulse, and oxygen                        saturations were monitored continuously. The                        Colonoscope was introduced through the anus and                        advanced to the the cecum, identified by appendiceal   orifice and ileocecal valve. The colonoscopy was                        performed without difficulty. The patient tolerated the                        procedure well. The quality of the bowel preparation                        was good. The ileocecal valve, appendiceal orifice, and                        rectum were photographed. Findings:      The perianal and digital rectal examinations were normal. Pertinent       negatives include normal sphincter tone and no palpable rectal lesions.      The colon (entire examined portion) appeared normal.      The exam was otherwise without abnormality. Impression:           - The entire examined colon is normal.                       - The examination was otherwise normal.                       - No specimens collected. Recommendation:       - Patient has a contact number available for                        emergencies. The signs and symptoms of potential                        delayed complications were discussed with the patient.                        Return to normal activities tomorrow. Written discharge                        instructions were provided to the patient.                       - Await pathology results from EGD, also performed                        today.                       - Consider other causes of weight loss                       - Return to GI office PRN. Procedure Code(s):    ---  Professional ---                       [id number], Colonoscopy, flexible; diagnostic, including                        collection of specimen(s) by brushing or washing, when                        performed (separate procedure) Diagnosis Code(s):    --- Professional ---                       R63.4, Abnormal weight loss                       D50.0, Iron deficiency anemia secondary to blood loss                        (chronic) CPT copyright 2017 American Medical Association. All rights reserved. The codes documented in this report are preliminary  and upon coder review may  be revised to meet current compliance requirements. Efrain Sella MD, MD 04/01/2018 10:06:03 AM This report has been signed electronically. Number of Addenda: 0 Note Initiated On: 04/01/2018 9:37 AM Scope Withdrawal Time: 0 hours 4 minutes 9 seconds  Total Procedure Duration: 0 hours 9 minutes 13 seconds       [facility]

## 2018-04-01 NOTE — Op Note (Signed)
[facility] Gastroenterology Patient Name: Dylan Santiago Procedure Date: 04/01/2018 9:37 AM MRN: [id number] Account #: [id number] Date of Birth: 1940/11/17 Admit Type: Outpatient Age: 77 Room: [facility] ENDO ROOM 4 Gender: Male Note Status: Finalized Procedure:            Upper GI endoscopy Indications:          Esophageal reflux, Weight loss Providers:            Benay Pike. Alice Reichert MD, MD Referring MD:         Tracie Harrier, MD (Referring MD) Medicines:            Propofol per Anesthesia Complications:        No immediate complications. Procedure:            Pre-Anesthesia Assessment:                       - The risks and benefits of the procedure and the                        sedation options and risks were discussed with the                        patient. All questions were answered and informed                        consent was obtained.                       - Patient identification and proposed procedure were                        verified prior to the procedure by the nurse. The                        procedure was verified in the procedure room.                       - ASA Grade Assessment: III - A patient with severe                        systemic disease.                       - After reviewing the risks and benefits, the patient                        was deemed in satisfactory condition to undergo the                        procedure.                       After obtaining informed consent, the endoscope was                        passed under direct vision. Throughout the procedure,                        the patient's blood pressure, pulse, and oxygen  saturations were monitored continuously. The Endoscope                        was introduced through the mouth, and advanced to the                        third part of duodenum. The upper GI endoscopy was                        accomplished without difficulty. The patient  tolerated                        the procedure well. Findings:      The examined esophagus was normal.      A non-obstructing Schatzki ring was found in the lower third of the       esophagus.      The entire examined stomach was normal.      The in the duodenum was normal. Biopsies for histology were taken with a       cold forceps for evaluation of celiac disease.      The exam was otherwise without abnormality. Impression:           - Normal esophagus.                       - Non-obstructing Schatzki ring.                       - Normal stomach.                       - Normal. Biopsied.                       - The examination was otherwise normal. Recommendation:       - Await pathology results.                       - Proceed with colonoscopy Procedure Code(s):    --- Professional ---                       [id number], Esophagogastroduodenoscopy, flexible, transoral;                        with biopsy, single or multiple Diagnosis Code(s):    --- Professional ---                       R63.4, Abnormal weight loss                       K21.9, Gastro-esophageal reflux disease without                        esophagitis                       K22.2, Esophageal obstruction CPT copyright 2017 American Medical Association. All rights reserved. The codes documented in this report are preliminary and upon coder review may  be revised to meet current compliance requirements. Efrain Sella MD, MD 04/01/2018 9:52:50 AM This report has been signed electronically. Number of Addenda: 0 Note Initiated On: 04/01/2018 9:37 AM  [facility]

## 2018-04-01 NOTE — Transfer of Care (Signed)
Immediate Anesthesia Transfer of Care Note  Patient: Dylan Santiago  Procedure(s) Performed: ESOPHAGOGASTRODUODENOSCOPY (EGD) WITH PROPOFOL (N/A ) COLONOSCOPY WITH PROPOFOL (N/A )  Patient Location: PACU and Endoscopy Unit  Anesthesia Type:General  Level of Consciousness: awake and drowsy  Airway & Oxygen Therapy: Patient Spontanous Breathing and Patient connected to nasal cannula oxygen  Post-op Assessment: Report given to RN and Post -op Vital signs reviewed and stable  Post vital signs: Reviewed and stable  Last Vitals:  Vitals Value Taken Time  BP    Temp    Pulse    Resp    SpO2      Last Pain:  Vitals:   04/01/18 0834  TempSrc: Tympanic  PainSc: 0-No pain         Complications: No apparent anesthesia complications

## 2018-04-01 NOTE — H&P (Signed)
Outpatient short stay form Pre-procedure 04/01/2018 8:50 AM Dylan Santiago, M.D.  Primary Physician: Tracie Harrier, M.D.  Reason for visit: Anemia, unexplained weight loss, GERD  History of present illness: Patient is a 77 year old male with recent drop in his hemoglobin with an unknown cause.  Iron studies and vitamin B12 levels were normal.  He has had a moderate weight loss there is also been unexplained.  Has chronic GERD that is managed with diet.  He has refused to take PPI but has no alarm symptoms.    Current Facility-Administered Medications:  .  0.9 %  sodium chloride infusion, , Intravenous, Continuous, [place], Benay Pike, MD, Last Rate: 20 mL/hr at 04/01/18 8657  Medications Prior to Admission  Medication Sig Dispense Refill Last Dose  . Cyanocobalamin 1000 MCG/ML KIT Inject 1,000 mcg as directed every 30 (thirty) days.   03/30/2018  . fexofenadine (ALLEGRA) 180 MG tablet Take 180 mg by mouth daily.   03/30/2018  . hydrOXYzine (ATARAX/VISTARIL) 10 MG tablet Take 10-20 mg by mouth at bedtime as needed for anxiety (sleep).   Past Month at Unknown time  . ibuprofen (ADVIL,MOTRIN) 200 MG tablet Take 400-800 mg by mouth every 8 (eight) hours as needed for fever, mild pain or moderate pain.   Past Month at Unknown time  . Multiple Vitamin (MULTIVITAMIN) tablet Take 1 tablet by mouth daily.   03/27/2018  . ondansetron (ZOFRAN) 4 MG tablet Take 4 mg by mouth every 8 (eight) hours as needed for nausea or vomiting.   Past Month at Unknown time  . testosterone (ANDROGEL) 50 MG/5GM (1%) GEL Place 5 g onto the skin daily.   03/30/2018  . acetaminophen (TYLENOL) 650 MG CR tablet Take 650 mg by mouth every 8 (eight) hours as needed for pain or fever.   PRN at PRN  . allopurinol (ZYLOPRIM) 300 MG tablet Take 300 mg by mouth daily.   07/21/2017 at am  . Alum Hydroxide-Mag Carbonate (GAVISCON EXTRA STRENGTH) 160-105 MG CHEW Chew 2 tablets by mouth 2 (two) times daily as needed (heartburn).   Past  Week at Unknown time  . aspirin EC 325 MG tablet Take 325 mg by mouth daily.   07/21/2017 at 0730  . cephALEXin (KEFLEX) 500 MG capsule Take 1 capsule (500 mg total) by mouth 3 (three) times daily. (Patient not taking: Reported on 04/01/2018) 30 capsule 0 Completed Course at Unknown time  . citalopram (CELEXA) 40 MG tablet Take 40 mg by mouth daily.   03/30/2018  . donepezil (ARICEPT) 5 MG tablet Take 5 mg by mouth at bedtime.   03/30/2018  . fenofibrate micronized (LOFIBRA) 134 MG capsule Take 134 mg by mouth daily.   03/30/2018  . glipiZIDE (GLUCOTROL XL) 2.5 MG 24 hr tablet Take 5 mg by mouth 2 (two) times daily.   03/30/2018  . quinapril (ACCUPRIL) 20 MG tablet Take 20 mg by mouth every morning.    03/30/2018  . risperiDONE (RISPERDAL) 0.5 MG tablet Take 0.5-1 mg by mouth 2 (two) times daily.   03/30/2018  . rosuvastatin (CRESTOR) 10 MG tablet Take 10 mg by mouth at bedtime.   03/30/2018  . senna (SENOKOT) 8.6 MG tablet Take 2 tablets by mouth at bedtime.    03/30/2018  . sitaGLIPtin (JANUVIA) 100 MG tablet Take 100 mg by mouth daily.   07/21/2017 at am  . tamsulosin (FLOMAX) 0.4 MG CAPS capsule Take 0.4 mg by mouth at bedtime.   03/30/2018     Allergies  Allergen  Reactions  . Lovastatin     Muscle pain   . Niacin And Related Photosensitivity  . Simvastatin     Muscle pain   . Vytorin [Ezetimibe-Simvastatin]   . Seasonal Ic [Cholestatin]     Uses allergy medication as needed.  . Tetanus Toxoids Rash     Past Medical History:  Diagnosis Date  . Arthritis   . BPH (benign prostatic hyperplasia)   . Cancer ([place])    skin cancer (right ear)  . Coronary artery disease   . Diabetes mellitus without complication ([place])   . Family history of adverse reaction to anesthesia    mom-n/v  . GERD (gastroesophageal reflux disease)   . History of kidney stones   . Hypertension   . Presence of permanent cardiac pacemaker 2017   second pacemaker placed 3 months ago    Review of systems:  Otherwise  negative.    Physical Exam  Gen: Alert, oriented. Appears stated age.  HEENT: [place]/AT. PERRLA. Lungs: CTA, no wheezes. CV: RR nl S1, S2. Abd: soft, benign, no masses. BS+ Ext: No edema. Pulses 2+    Planned procedures: Proceed with EGD and colonoscopy. The patient understands the nature of the planned procedure, indications, risks, alternatives and potential complications including but not limited to bleeding, infection, perforation, damage to internal organs and possible oversedation/side effects from anesthesia. The patient agrees and gives consent to proceed.  Please refer to procedure notes for findings, recommendations and patient disposition/instructions.     Dylan Santiago, M.D. Gastroenterology 04/01/2018  8:51 AM

## 2018-04-02 ENCOUNTER — Encounter: Payer: Self-pay | Admitting: Internal Medicine

## 2018-04-02 LAB — SURGICAL PATHOLOGY

## 2018-04-02 NOTE — Anesthesia Postprocedure Evaluation (Signed)
Anesthesia Post Note  Patient: Dylan Santiago  Procedure(s) Performed: ESOPHAGOGASTRODUODENOSCOPY (EGD) WITH PROPOFOL (N/A ) COLONOSCOPY WITH PROPOFOL (N/A )  Patient location during evaluation: Endoscopy Anesthesia Type: General Level of consciousness: awake and alert Pain management: pain level controlled Vital Signs Assessment: post-procedure vital signs reviewed and stable Respiratory status: spontaneous breathing, nonlabored ventilation, respiratory function stable and patient connected to nasal cannula oxygen Cardiovascular status: blood pressure returned to baseline and stable Postop Assessment: no apparent nausea or vomiting Anesthetic complications: no     Last Vitals:  Vitals:   04/01/18 1030 04/01/18 1040  BP: (!) 142/78 (!) 151/49  Pulse: 79 76  Resp: 12 19  Temp:    SpO2: 100% 100%    Last Pain:  Vitals:   04/02/18 0810  TempSrc:   PainSc: 0-No pain                 Martha Clan

## 2020-01-02 IMAGING — CT CT RENAL STONE PROTOCOL
2 of 4 series · 16 of 46 positions shown, 18 images · non-contrast
Comparison: 07/22/2017

CLINICAL DATA: Right-sided flank pain and dizziness

EXAM:
CT ABDOMEN AND PELVIS WITHOUT CONTRAST
TECHNIQUE: Multidetector CT imaging of the abdomen and pelvis was performed
following the standard protocol without IV contrast.

[Series 2: stone full standard · axial · 0.79mm/px · z∈[-965,-535]mm · 13 of 94 slices shown, 15 images]
[im 4/94  soft-tissue]
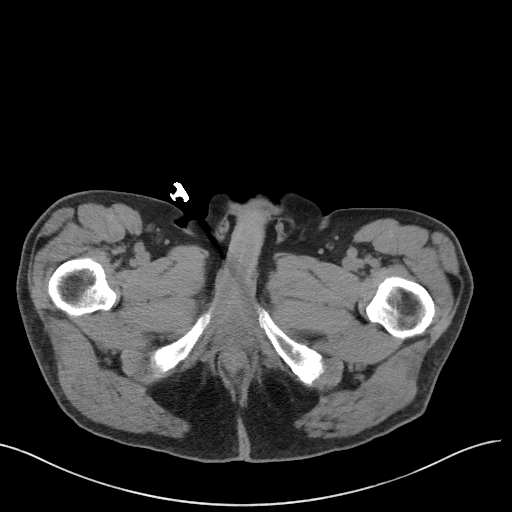
[im 4/94  bone]
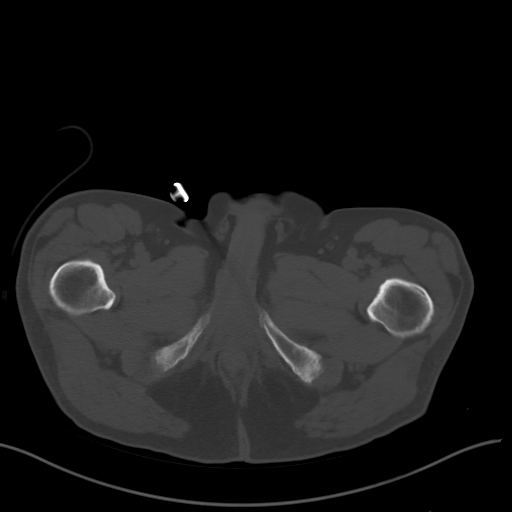
[im 12/94  soft-tissue]
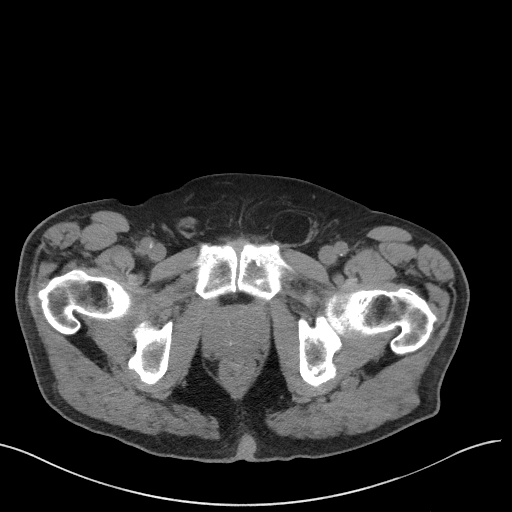
[im 20/94  soft-tissue]
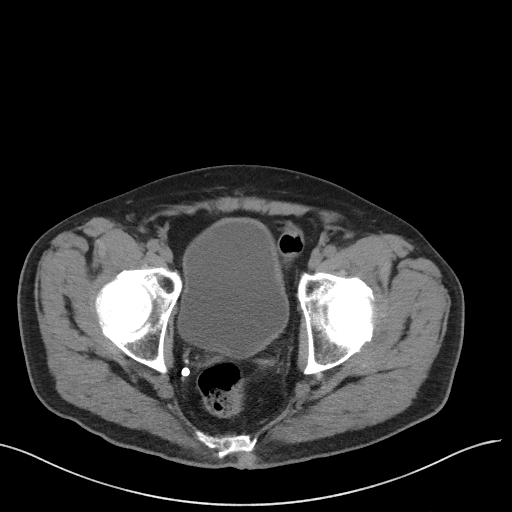
[im 28/94  soft-tissue]
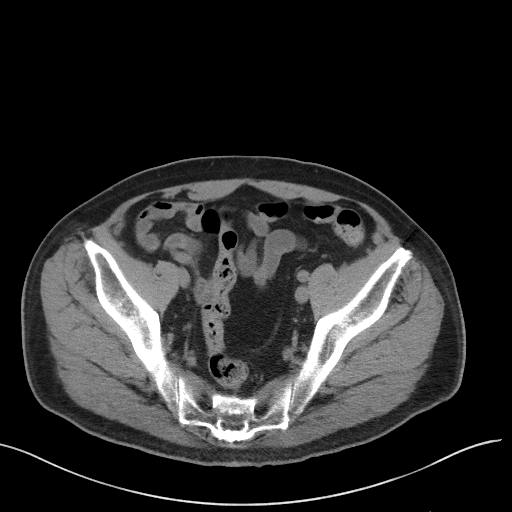
[im 32/94  soft-tissue]
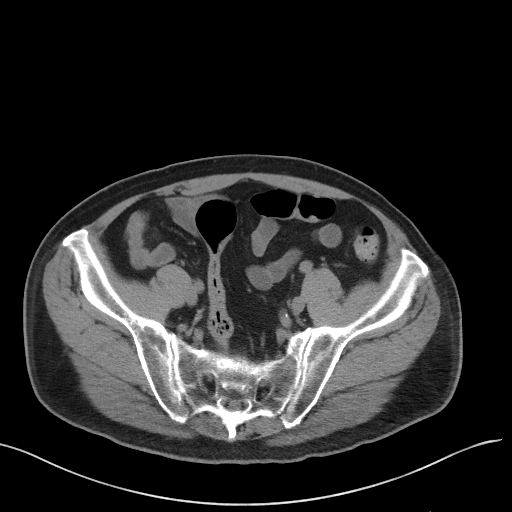
[im 39/94  soft-tissue]
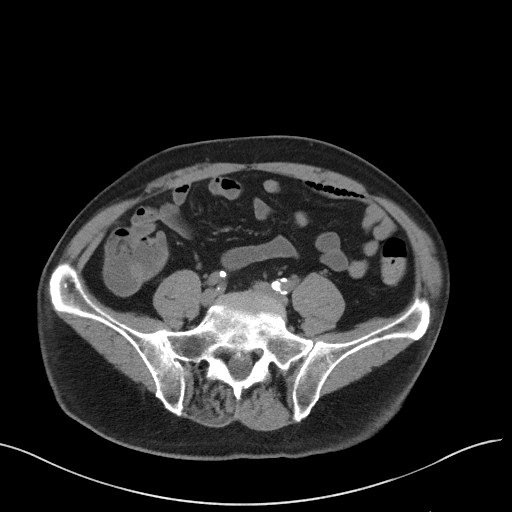
[im 47/94  soft-tissue]
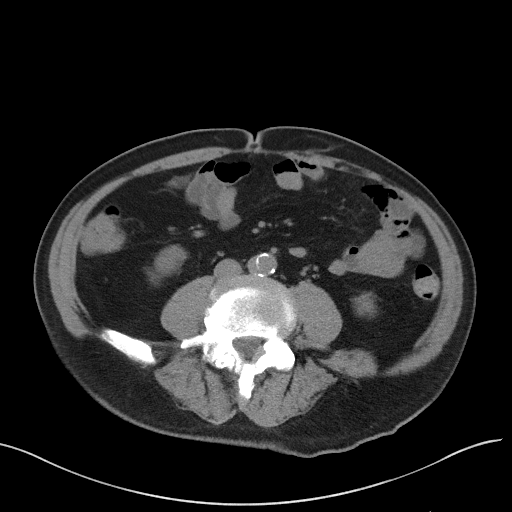
[im 55/94  soft-tissue]
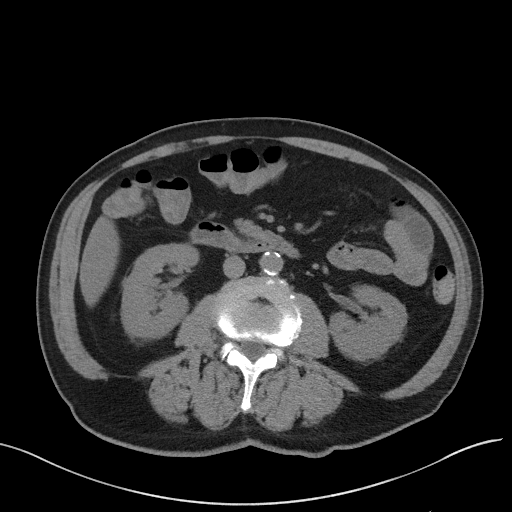
[im 63/94  soft-tissue]
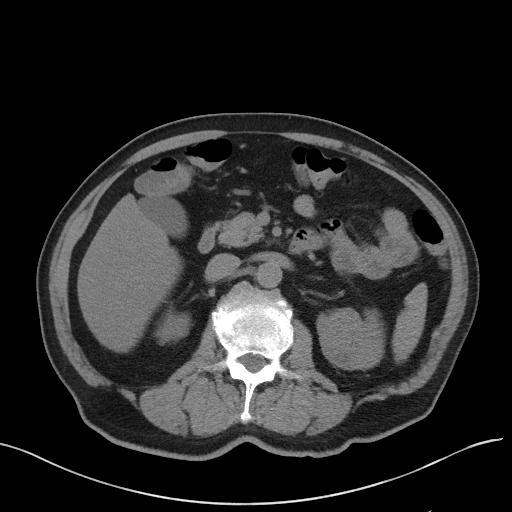
[im 63/94  bone]
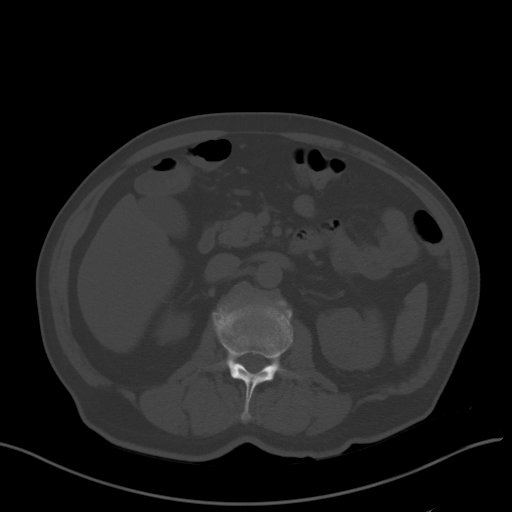
[im 66/94  soft-tissue]
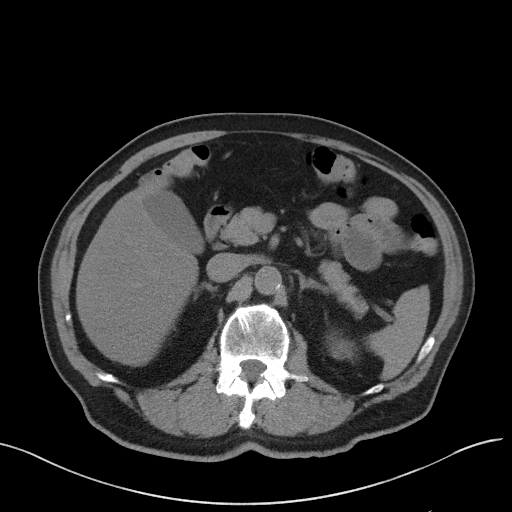
[im 74/94  soft-tissue]
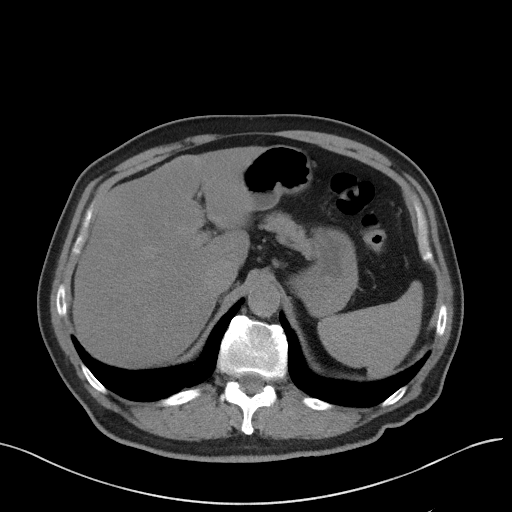
[im 82/94  soft-tissue]
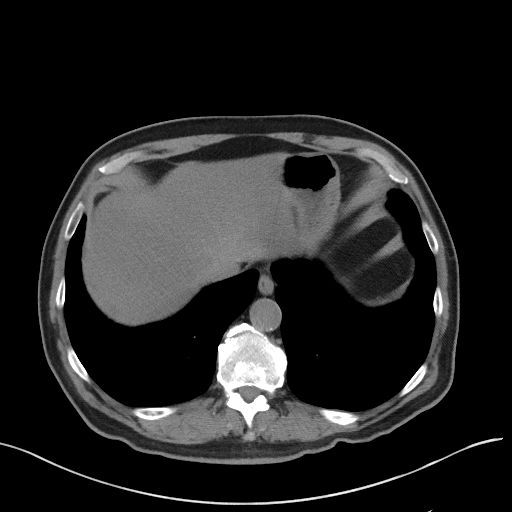
[im 90/94  soft-tissue]
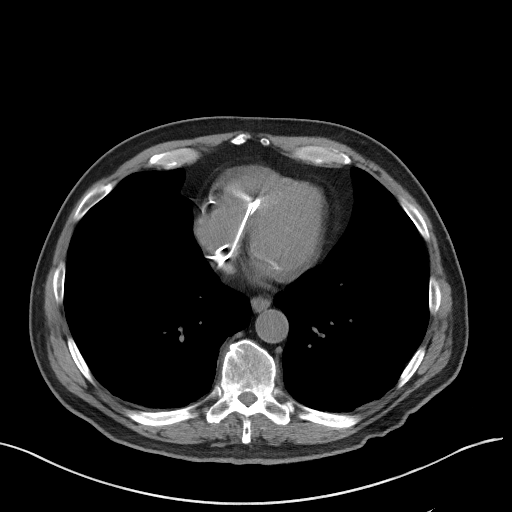

[Series 5: coronal · coronal · 0.79mm/px · 3 of 129 slices shown]
[im 43/129  soft-tissue]
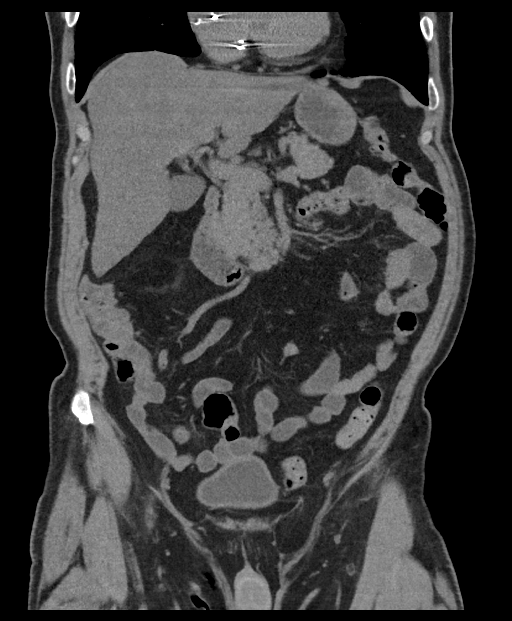
[im 57/129  soft-tissue]
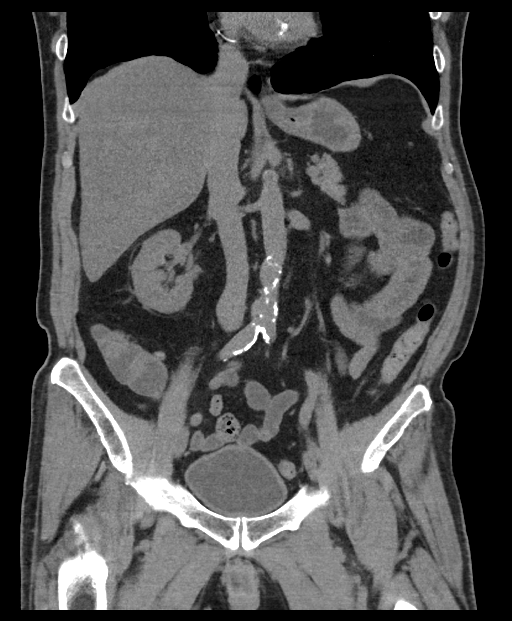
[im 72/129  soft-tissue]
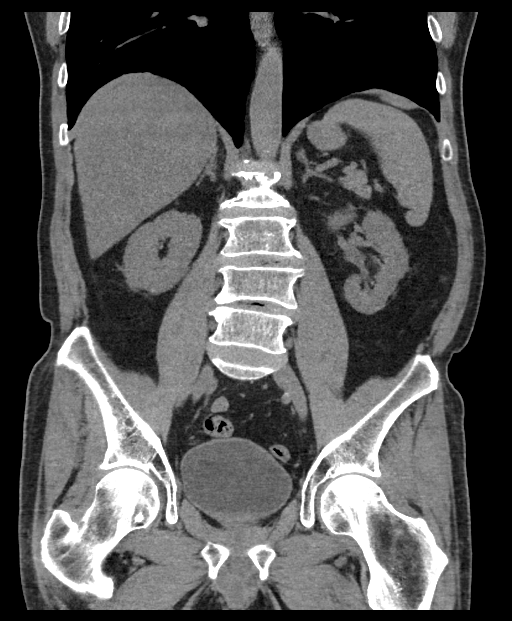

[16 of 46 positions shown; findings below may reference images not displayed]

FINDINGS: Lower chest: Lung bases demonstrate no acute consolidation or
pleural effusion. Partially visualized cardiac pacing leads.

Hepatobiliary: Steatosis. No calcified gallstones or biliary
dilatation.

Pancreas: Unremarkable. No pancreatic ductal dilatation or
surrounding inflammatory changes.

Spleen: Normal in size without focal abnormality.

Adrenals/Urinary Tract: Adrenal glands are within normal limits.
Hypodense cortical lesions in the kidneys, possible cysts. No
hydronephrosis. No ureteral stone.

Stomach/Bowel: The stomach is nonenlarged. No dilated small bowel.
No colon wall thickening. Normal appendix.

Vascular/Lymphatic: Marked aortic atherosclerosis. No aneurysmal
dilatation. No significantly enlarged lymph nodes.

Reproductive: Enlarged prostate with calcifications and probable
surgical defect. This is unchanged.

Other: Negative for free air or free fluid. Fat containing left
inguinal hernia.

Musculoskeletal: No acute or suspicious abnormality.
IMPRESSION: 1. Negative for hydronephrosis or ureteral stone.
2. Negative appendix
3. Hepatic steatosis

## 2022-09-10 ENCOUNTER — Other Ambulatory Visit (HOSPITAL_COMMUNITY): Payer: Self-pay | Admitting: Neurology

## 2022-09-10 DIAGNOSIS — F03918 Unspecified dementia, unspecified severity, with other behavioral disturbance: Secondary | ICD-10-CM

## 2022-09-27 ENCOUNTER — Other Ambulatory Visit: Payer: Self-pay | Admitting: Neurology

## 2022-09-27 DIAGNOSIS — R413 Other amnesia: Secondary | ICD-10-CM

## 2022-09-27 DIAGNOSIS — F03918 Unspecified dementia, unspecified severity, with other behavioral disturbance: Secondary | ICD-10-CM

## 2022-10-09 ENCOUNTER — Ambulatory Visit
Admission: RE | Admit: 2022-10-09 | Discharge: 2022-10-09 | Disposition: A | Discharge status: Home / Self Care | Payer: Medicare Other | Source: Ambulatory Visit | Attending: Neurology | Admitting: Neurology

## 2022-10-09 DIAGNOSIS — F03918 Unspecified dementia, unspecified severity, with other behavioral disturbance: Secondary | ICD-10-CM

## 2022-10-09 DIAGNOSIS — R413 Other amnesia: Secondary | ICD-10-CM
